# Patient Record
Sex: Female | Born: 2000 | Race: Black or African American | Hispanic: No | Marital: Single | Smoking: Never smoker
Health system: Southern US, Community
[De-identification: ages and names within clinical notes are randomized; demographics above are authoritative.]

## PROBLEM LIST (undated history)

## (undated) DIAGNOSIS — E282 Polycystic ovarian syndrome: Secondary | ICD-10-CM

## (undated) HISTORY — PX: TONSILLECTOMY: SUR1361

---

## 2021-02-07 ENCOUNTER — Emergency Department (HOSPITAL_COMMUNITY)
Admission: EM | Admit: 2021-02-07 | Discharge: 2021-02-08 | Disposition: A | Discharge status: Home / Self Care | Payer: Medicaid - Out of State | Attending: Emergency Medicine | Admitting: Emergency Medicine

## 2021-02-07 DIAGNOSIS — R1012 Left upper quadrant pain: Secondary | ICD-10-CM | POA: Diagnosis present

## 2021-02-07 DIAGNOSIS — Z91040 Latex allergy status: Secondary | ICD-10-CM | POA: Insufficient documentation

## 2021-02-07 DIAGNOSIS — Z91010 Allergy to peanuts: Secondary | ICD-10-CM | POA: Diagnosis not present

## 2021-02-07 LAB — URINALYSIS, COMPLETE (UACMP) WITH MICROSCOPIC
Bacteria, UA: NONE SEEN
Bilirubin Urine: NEGATIVE
Glucose, UA: NEGATIVE mg/dL
Ketones, ur: NEGATIVE mg/dL
Leukocytes,Ua: NEGATIVE
Nitrite: NEGATIVE
Protein, ur: NEGATIVE mg/dL
Specific Gravity, Urine: 1.012 (ref 1.005–1.030)
pH: 5 (ref 5.0–8.0)

## 2021-02-07 LAB — CBC WITH DIFFERENTIAL/PLATELET
Abs Immature Granulocytes: 0.03 10*3/uL (ref 0.00–0.07)
Basophils Absolute: 0.1 10*3/uL (ref 0.0–0.1)
Basophils Relative: 0 %
Eosinophils Absolute: 0.4 10*3/uL (ref 0.0–0.5)
Eosinophils Relative: 3 %
HCT: 41.7 % (ref 36.0–46.0)
Hemoglobin: 13.4 g/dL (ref 12.0–15.0)
Immature Granulocytes: 0 %
Lymphocytes Relative: 28 %
Lymphs Abs: 3.9 10*3/uL (ref 0.7–4.0)
MCH: 26.3 pg (ref 26.0–34.0)
MCHC: 32.1 g/dL (ref 30.0–36.0)
MCV: 81.9 fL (ref 80.0–100.0)
Monocytes Absolute: 0.9 10*3/uL (ref 0.1–1.0)
Monocytes Relative: 6 %
Neutro Abs: 9 10*3/uL — ABNORMAL HIGH (ref 1.7–7.7)
Neutrophils Relative %: 63 %
Platelets: 335 10*3/uL (ref 150–400)
RBC: 5.09 MIL/uL (ref 3.87–5.11)
RDW: 14.6 % (ref 11.5–15.5)
WBC: 14.3 10*3/uL — ABNORMAL HIGH (ref 4.0–10.5)
nRBC: 0 % (ref 0.0–0.2)

## 2021-02-07 LAB — COMPREHENSIVE METABOLIC PANEL
ALT: 15 U/L (ref 0–44)
AST: 16 U/L (ref 15–41)
Albumin: 3.2 g/dL — ABNORMAL LOW (ref 3.5–5.0)
Alkaline Phosphatase: 56 U/L (ref 38–126)
Anion gap: 11 (ref 5–15)
BUN: 6 mg/dL (ref 6–20)
CO2: 21 mmol/L — ABNORMAL LOW (ref 22–32)
Calcium: 8.9 mg/dL (ref 8.9–10.3)
Chloride: 106 mmol/L (ref 98–111)
Creatinine, Ser: 0.66 mg/dL (ref 0.44–1.00)
GFR, Estimated: >60 mL/min (ref >60)
Glucose, Bld: 89 mg/dL (ref 70–99)
Potassium: 3.9 mmol/L (ref 3.5–5.1)
Sodium: 138 mmol/L (ref 135–145)
Total Bilirubin: 0.2 mg/dL — ABNORMAL LOW (ref 0.3–1.2)
Total Protein: 7.4 g/dL (ref 6.5–8.1)

## 2021-02-07 LAB — I-STAT BETA HCG BLOOD, ED (MC, WL, AP ONLY): I-stat hCG, quantitative: <5 m[IU]/mL (ref <5)

## 2021-02-07 LAB — LIPASE, BLOOD: Lipase: 26 U/L (ref 11–51)

## 2021-02-07 LAB — LACTIC ACID, PLASMA: Lactic Acid, Venous: 0.8 mmol/L (ref 0.5–1.9)

## 2021-02-07 MED ORDER — ALUM & MAG HYDROXIDE-SIMETH 200-200-20 MG/5ML PO SUSP
30.0000 mL | Freq: Once | ORAL | Status: AC
  Administered 2021-02-07: 30 mL via ORAL
  Filled 2021-02-07: qty 30

## 2021-02-07 MED ORDER — ACETAMINOPHEN 325 MG PO TABS
650.0000 mg | ORAL_TABLET | Freq: Once | ORAL | Status: AC
  Administered 2021-02-07: 650 mg via ORAL
  Filled 2021-02-07: qty 2

## 2021-02-07 NOTE — ED Provider Notes (Signed)
[facility] EMERGENCY DEPARTMENT Provider Note   CSN: [id number] Arrival date & time: 02/07/21  2038     History Chief Complaint  Patient presents with   Abdominal Pain    Mary David is a 20 y.o. female.  HPI  Patient is a 20 year old female with a history of PCOS, obesity, and immunoglobulin autoimmune deficiency who presents to the ED with complaints of left upper quadrant pain.  Patient reports that it has been constant for the past 3 days and there was no known inciting incident, such as falls, new medications, or known sick contacts.  She describes it as "like when I had mono" and a sensation of a balloon inflating pressure with occasional sharp pains.  It is worsened with changes in posture, and improved by lying flat.  She has not tried anything at home but it became significantly worse today prompting her arrival.  She reports that she has felt more tired than usual and that she had a "scratchy throat" yesterday that has resolved.  She states that yesterday she finished a "month-long period," which is common with her PCOS.  She has had small brown spotting today insistent with end of menses.  She uses a NuvaRing for contraception and denies any prior or current pregnancies. Endorses 1-2 drinks per week ETOH, denies other drug use.   No past medical history on file.  There are no problems to display for this patient.    OB History   No obstetric history on file.     No family history on file.    Home Medications Prior to Admission medications   Not on File    Allergies    Latex, Peanut-containing drug products, Penicillins, and Pork-derived products  Review of Systems   Review of Systems  Constitutional:  Negative for activity change, appetite change, chills and fever.  HENT:  Negative for sore throat and trouble swallowing.   Eyes:  Negative for pain and visual disturbance.  Respiratory:  Negative for cough and shortness of breath.    Cardiovascular:  Negative for chest pain and palpitations.  Gastrointestinal:  Positive for abdominal pain. Negative for diarrhea, nausea and vomiting.  Genitourinary:  Negative for decreased urine volume, difficulty urinating, dyspareunia, dysuria, flank pain, frequency, hematuria, urgency, vaginal discharge and vaginal pain.  Musculoskeletal:  Negative for arthralgias and back pain.  Skin:  Negative for color change, pallor and rash.  Allergic/Immunologic: Negative for immunocompromised state.  Neurological:  Negative for dizziness, seizures, syncope, weakness, light-headedness, numbness and headaches.  Hematological:  Does not bruise/bleed easily.  Psychiatric/Behavioral:  Negative for confusion. The patient is not nervous/anxious.   All other systems reviewed and are negative.  Physical Exam Updated Vital Signs BP 103/75   Pulse 65   Temp 98.7 F (37.1 C) (Oral)   Resp 19   SpO2 100%   Physical Exam Vitals and nursing note reviewed.  Constitutional:      General: She is not in acute distress.    Appearance: Normal appearance. She is well-developed. She is obese. She is not ill-appearing, toxic-appearing or diaphoretic.  HENT:     Head: Normocephalic and atraumatic.     Right Ear: External ear normal.     Left Ear: External ear normal.     Nose: Nose normal.     Mouth/Throat:     Mouth: Mucous membranes are moist.     Pharynx: Oropharynx is clear. No pharyngeal swelling or oropharyngeal exudate.  Eyes:     General:  No scleral icterus.    Extraocular Movements: Extraocular movements intact.     Conjunctiva/sclera: Conjunctivae normal.     Pupils: Pupils are equal, round, and reactive to light.  Cardiovascular:     Rate and Rhythm: Normal rate and regular rhythm.     Pulses: Normal pulses.     Heart sounds: Normal heart sounds. No murmur heard. Pulmonary:     Effort: Pulmonary effort is normal. No respiratory distress.     Breath sounds: Normal breath sounds.   Abdominal:     General: Abdomen is flat. There is no distension. There are no signs of injury.     Palpations: Abdomen is soft. There is no hepatomegaly, splenomegaly, mass or pulsatile mass.     Tenderness: There is abdominal tenderness in the left upper quadrant. There is right CVA tenderness. There is no left CVA tenderness, guarding or rebound. Negative signs include Murphy's sign, Rovsing's sign and McBurney's sign.     Hernia: No hernia is present.     Comments: Subjective TTP, tolerates deep palpation to LUQ without apparent distress  Musculoskeletal:        General: No swelling. Normal range of motion.     Cervical back: Normal range of motion and neck supple. No rigidity or tenderness.     Right lower leg: No edema.     Left lower leg: No edema.  Lymphadenopathy:     Cervical: No cervical adenopathy.  Skin:    General: Skin is warm and dry.     Capillary Refill: Capillary refill takes less than 2 seconds.  Neurological:     General: No focal deficit present.     Mental Status: She is alert and oriented to person, place, and time. Mental status is at baseline.     GCS: GCS eye subscore is 4. GCS verbal subscore is 5. GCS motor subscore is 6.  Psychiatric:        Mood and Affect: Mood normal.        Behavior: Behavior normal.    ED Results / Procedures / Treatments   Labs (all labs ordered are listed, but only abnormal results are displayed) Labs Reviewed  CBC WITH DIFFERENTIAL/PLATELET - Abnormal; Notable for the following components:      Result Value   WBC 14.3 (*)    Neutro Abs 9.0 (*)    All other components within normal limits  COMPREHENSIVE METABOLIC PANEL - Abnormal; Notable for the following components:   CO2 21 (*)    Albumin 3.2 (*)    Total Bilirubin 0.2 (*)    All other components within normal limits  URINALYSIS, COMPLETE (UACMP) WITH MICROSCOPIC - Abnormal; Notable for the following components:   Hgb urine dipstick SMALL (*)    All other components  within normal limits  LACTIC ACID, PLASMA  LIPASE, BLOOD  I-STAT BETA HCG BLOOD, ED ([facility], [facility], [facility] ONLY)    EKG EKG Interpretation  Date/Time:  Sunday February 07 2021 23:38:32 EST Ventricular Rate:  70 PR Interval:  163 QRS Duration: 81 QT Interval:  391 QTC Calculation: 422 R Axis:   71 Text Interpretation: Sinus rhythm No old tracing to compare Confirmed by Linwood Dibbles [phone]) on 02/07/2021 11:40:21 PM  Radiology No results found.  Procedures Procedures   Medications Ordered in ED Medications  alum & mag hydroxide-simeth (MAALOX/MYLANTA) 200-200-20 MG/5ML suspension 30 mL (30 mLs Oral Given 02/07/21 2252)  acetaminophen (TYLENOL) tablet 650 mg (650 mg Oral Given 02/07/21 2215)    ED  Course  I have reviewed the triage vital signs and the nursing notes.  Pertinent labs & imaging results that were available during my care of the patient were reviewed by me and considered in my medical decision making (see chart for details).    MDM Rules/Calculators/A&P                          Mary David is a 20 y.o. female presenting with abdominal pain. Initial VS wnl.   EKG interpretation: NSR, rate 70 bpm, normal intervals, no ST elevations or depressions, T wave inversion in aVR.  No prior EKGs..  Labs: UA with small hematuria, otherwise unremarkable.  Beta hCG undetectable.  Mild leukocytosis with left shift, similar to prior in outside records.  CMP WNL.  Lactic acidosis and lipase WNL.  DDX considered: Appendicitis, cholecystitis, choledocholithiasis, nephrolithiasis, UTI, pyelonephritis, shingles, diverticulitis, constipation, pneumonia, pancreatitis, IUP, ectopic pregnancy, TOA or STI, GERD.  History, examination, and objective data most consistent with mild abdominal pain of unclear etiology.  Patient has no abdominal pain at sites consistent with appendicitis or gallbladder disease.  No jaundice or Murphy sign.  No systemic infectious signs or symptoms.  No heartburn or  nausea.  No respiratory symptoms.  Appears clinically well-hydrated and tolerating p.o. without issue.  Suspect small hematuria consistent with recent menses, patient has no flank tenderness or dysuria, and constant pain inconsistent with nephrolithiasis.  No skin findings.  Pregnancy negative.  Patient denies any urinary or pelvic symptoms.  Patient is overall well-appearing, and there is very low suspicion for emergent etiology.  Abdominal exam overall benign and tolerates deep palpation without any apparent discomfort.  Medications: Medications  alum & mag hydroxide-simeth (MAALOX/MYLANTA) 200-200-20 MG/5ML suspension 30 mL (30 mLs Oral Given 02/07/21 2252)  acetaminophen (TYLENOL) tablet 650 mg (650 mg Oral Given 02/07/21 2215)    Re-evaluated, states symptoms remain present.  Patient appears comfortable, using phone on multiple exams. Hemodynamically stable and in no acute distress.  Shared decision making held with patient, who elects for discharge home with close outpatient follow-up.  Discharged home in stable condition. Strict ED return precautions advised. Supportive care discussed. Outpatient PCP follow-up advised. Patient understands and agrees with the plan.  The plan for this patient was discussed with my attending physician, who voiced agreement and who oversaw evaluation and treatment of this patient.     Note: Estate manager/land agent was used in the creation of this note.  Final Clinical Impression(s) / ED Diagnoses Final diagnoses:  Left upper quadrant abdominal pain    Rx / DC Orders ED Discharge Orders     None        Cherly Hensen, DO 02/08/21 [id number]    Dorie Rank, MD 02/09/21 [phone]

## 2021-02-07 NOTE — ED Triage Notes (Signed)
Sxs onset 3 days ago. Describes squeezing pressure. Front left side. Verbalizes sharp pain occasionally when sitting. Cycle ended yesterday. States it lasted a prolonged amount of time. Around a month.Feels bloated.

## 2021-02-08 NOTE — Discharge Instructions (Signed)
Dear Mary David,  Thank you for allowing [place] to take care of you today.  We hope you begin feeling better soon.  - Please follow-up with your primary care physician or schedule an appointment to establish a primary care doctor if you do not have one already. - Please return to the Emergency Department or call 911 for chest pain, shortness of breath, severe pain, altered mental status, or if you have any reason to think you may need emergency medical care. - Remember to stay well hydrated - Continue to use Ibuprofen and Tylenol as directed as needed for pain - Call your PCP for close follow up as discussed   Sincerely,  Dwaine Gale, DO Department of Emergency Medicine [place]   Left upper quadrant abdominal pain

## 2021-02-19 ENCOUNTER — Encounter (HOSPITAL_COMMUNITY): Payer: Self-pay

## 2021-02-19 ENCOUNTER — Emergency Department (HOSPITAL_COMMUNITY)
Admission: EM | Admit: 2021-02-19 | Discharge: 2021-02-19 | Disposition: A | Discharge status: Home / Self Care | Payer: Medicaid - Out of State | Attending: Emergency Medicine | Admitting: Emergency Medicine

## 2021-02-19 DIAGNOSIS — R519 Headache, unspecified: Secondary | ICD-10-CM | POA: Insufficient documentation

## 2021-02-19 DIAGNOSIS — Z5321 Procedure and treatment not carried out due to patient leaving prior to being seen by health care provider: Secondary | ICD-10-CM | POA: Insufficient documentation

## 2021-02-19 DIAGNOSIS — R112 Nausea with vomiting, unspecified: Secondary | ICD-10-CM | POA: Insufficient documentation

## 2021-02-19 NOTE — ED Notes (Signed)
Pt arrived via POV, c/o nausea x2 wks, headache, and vomiting x2 days. States endorses sick contacts at work.

## 2021-02-20 ENCOUNTER — Other Ambulatory Visit: Payer: Self-pay

## 2021-02-20 ENCOUNTER — Emergency Department (HOSPITAL_COMMUNITY)
Admission: EM | Admit: 2021-02-20 | Discharge: 2021-02-20 | Disposition: A | Discharge status: Home / Self Care | Payer: Medicaid - Out of State | Attending: Student | Admitting: Student

## 2021-02-20 DIAGNOSIS — N9489 Other specified conditions associated with female genital organs and menstrual cycle: Secondary | ICD-10-CM | POA: Insufficient documentation

## 2021-02-20 DIAGNOSIS — R112 Nausea with vomiting, unspecified: Secondary | ICD-10-CM | POA: Diagnosis present

## 2021-02-20 DIAGNOSIS — Z20822 Contact with and (suspected) exposure to covid-19: Secondary | ICD-10-CM | POA: Diagnosis not present

## 2021-02-20 DIAGNOSIS — Z91040 Latex allergy status: Secondary | ICD-10-CM | POA: Insufficient documentation

## 2021-02-20 DIAGNOSIS — R197 Diarrhea, unspecified: Secondary | ICD-10-CM | POA: Diagnosis not present

## 2021-02-20 DIAGNOSIS — R1084 Generalized abdominal pain: Secondary | ICD-10-CM | POA: Diagnosis not present

## 2021-02-20 DIAGNOSIS — Z91010 Allergy to peanuts: Secondary | ICD-10-CM | POA: Insufficient documentation

## 2021-02-20 DIAGNOSIS — R519 Headache, unspecified: Secondary | ICD-10-CM | POA: Insufficient documentation

## 2021-02-20 LAB — URINALYSIS, ROUTINE W REFLEX MICROSCOPIC
Bilirubin Urine: NEGATIVE
Glucose, UA: NEGATIVE mg/dL
Ketones, ur: NEGATIVE mg/dL
Leukocytes,Ua: NEGATIVE
Nitrite: NEGATIVE
Protein, ur: NEGATIVE mg/dL
Specific Gravity, Urine: 1.016 (ref 1.005–1.030)
pH: 5 (ref 5.0–8.0)

## 2021-02-20 LAB — CBC
HCT: 41.3 % (ref 36.0–46.0)
Hemoglobin: 13.2 g/dL (ref 12.0–15.0)
MCH: 26.1 pg (ref 26.0–34.0)
MCHC: 32 g/dL (ref 30.0–36.0)
MCV: 81.8 fL (ref 80.0–100.0)
Platelets: 347 10*3/uL (ref 150–400)
RBC: 5.05 MIL/uL (ref 3.87–5.11)
RDW: 14.6 % (ref 11.5–15.5)
WBC: 11.9 10*3/uL — ABNORMAL HIGH (ref 4.0–10.5)
nRBC: 0 % (ref 0.0–0.2)

## 2021-02-20 LAB — COMPREHENSIVE METABOLIC PANEL
ALT: 14 U/L (ref 0–44)
AST: 20 U/L (ref 15–41)
Albumin: 3 g/dL — ABNORMAL LOW (ref 3.5–5.0)
Alkaline Phosphatase: 60 U/L (ref 38–126)
Anion gap: 8 (ref 5–15)
BUN: 5 mg/dL — ABNORMAL LOW (ref 6–20)
CO2: 24 mmol/L (ref 22–32)
Calcium: 9.1 mg/dL (ref 8.9–10.3)
Chloride: 109 mmol/L (ref 98–111)
Creatinine, Ser: 0.91 mg/dL (ref 0.44–1.00)
GFR, Estimated: >60 mL/min (ref >60)
Glucose, Bld: 115 mg/dL — ABNORMAL HIGH (ref 70–99)
Potassium: 4.4 mmol/L (ref 3.5–5.1)
Sodium: 141 mmol/L (ref 135–145)
Total Bilirubin: 0.4 mg/dL (ref 0.3–1.2)
Total Protein: 6.7 g/dL (ref 6.5–8.1)

## 2021-02-20 LAB — LIPASE, BLOOD: Lipase: 25 U/L (ref 11–51)

## 2021-02-20 LAB — RESP PANEL BY RT-PCR (FLU A&B, COVID) ARPGX2
Influenza A by PCR: NEGATIVE
Influenza B by PCR: NEGATIVE
SARS Coronavirus 2 by RT PCR: NEGATIVE

## 2021-02-20 LAB — I-STAT BETA HCG BLOOD, ED (MC, WL, AP ONLY): I-stat hCG, quantitative: <5 m[IU]/mL (ref <5)

## 2021-02-20 MED ORDER — ONDANSETRON HCL 4 MG PO TABS: 4.0000 mg | ORAL_TABLET | Freq: Four times a day (QID) | ORAL | 0 refills | Status: DC

## 2021-02-20 MED ORDER — ONDANSETRON 4 MG PO TBDP
4.0000 mg | ORAL_TABLET | Freq: Once | ORAL | Status: AC
  Administered 2021-02-20: 4 mg via ORAL
  Filled 2021-02-20: qty 1

## 2021-02-20 NOTE — ED Triage Notes (Signed)
Pt. Stated, Mary David had a headache with N/V/D for the last 2 days , it started with a headache and the N/V/D started later.

## 2021-02-20 NOTE — ED Provider Notes (Signed)
[facility] EMERGENCY DEPARTMENT Provider Note   CSN: [id number] Arrival date & time: 02/20/21  1002     History Chief Complaint  Patient presents with   Emesis   Nausea   Headache   Diarrhea    Mary David is a 20 y.o. female with no significant past medical history who presents to the ED complaining of headache x2 days.  Patient denies sick contacts.  Patient has associated abdominal cramping (localized to upper region), nausea, vomiting, and diarrhea.  She has tried over-the-counter medications with mild relief of her symptoms.  She denies fever, chills abdominal pain, chest pain, shortness of breath, dizziness, lightheadedness, dysuria, hematuria.  Patient denies chance of pregnancy. Patient's last menstrual period was 02/15/2021.  She has been having spotting status post her menstrual period for the past 4 days.    The history is provided by the patient. No language interpreter was used.      No past medical history on file.  There are no problems to display for this patient.   No past surgical history on file.   OB History   No obstetric history on file.     No family history on file.  Social History   Tobacco Use   Smoking status: Never   Smokeless tobacco: Never    Home Medications Prior to Admission medications   Medication Sig Start Date End Date Taking? Authorizing Provider  ondansetron (ZOFRAN) 4 MG tablet Take 1 tablet (4 mg total) by mouth every 6 (six) hours. 02/20/21  Yes Zori Benbrook A, PA-C    Allergies    Latex, Peanut-containing drug products, Penicillins, and Pork-derived products  Review of Systems   Review of Systems  Constitutional:  Negative for chills and fever.  Respiratory:  Negative for shortness of breath.   Cardiovascular:  Negative for chest pain.  Gastrointestinal:  Positive for diarrhea and vomiting. Negative for abdominal pain and nausea.  Genitourinary:  Negative for difficulty urinating, dysuria,  hematuria, vaginal bleeding and vaginal discharge.  Neurological:  Positive for headaches.  All other systems reviewed and are negative.  Physical Exam Updated Vital Signs BP 134/85 (BP Location: Left Arm)   Pulse 94   Temp 100.2 F (37.9 C) (Oral)   Resp 16   LMP 02/15/2021   SpO2 95%   Physical Exam Vitals and nursing note reviewed.  Constitutional:      General: She is not in acute distress.    Appearance: She is not diaphoretic.  HENT:     Head: Normocephalic and atraumatic.     Mouth/Throat:     Pharynx: No oropharyngeal exudate.  Eyes:     General: No scleral icterus.    Conjunctiva/sclera: Conjunctivae normal.  Cardiovascular:     Rate and Rhythm: Normal rate and regular rhythm.     Pulses: Normal pulses.     Heart sounds: Normal heart sounds.  Pulmonary:     Effort: Pulmonary effort is normal. No respiratory distress.     Breath sounds: Normal breath sounds. No wheezing.  Abdominal:     General: Bowel sounds are normal.     Palpations: Abdomen is soft. There is no mass.     Tenderness: There is generalized abdominal tenderness. There is no guarding or rebound.     Comments: Mild diffuse abdominal tenderness to palpation.  No overlying skin changes.  Musculoskeletal:        General: Normal range of motion.     Cervical back: Normal range of motion  and neck supple.     Comments: Strength and sensation intact to bilateral upper and lower extremities.  Skin:    General: Skin is warm and dry.  Neurological:     Mental Status: She is alert.  Psychiatric:        Behavior: Behavior normal.    ED Results / Procedures / Treatments   Labs (all labs ordered are listed, but only abnormal results are displayed) Labs Reviewed  COMPREHENSIVE METABOLIC PANEL - Abnormal; Notable for the following components:      Result Value   Glucose, Bld 115 (*)    BUN 5 (*)    Albumin 3.0 (*)    All other components within normal limits  CBC - Abnormal; Notable for the following  components:   WBC 11.9 (*)    All other components within normal limits  URINALYSIS, ROUTINE W REFLEX MICROSCOPIC - Abnormal; Notable for the following components:   Hgb urine dipstick MODERATE (*)    Bacteria, UA RARE (*)    All other components within normal limits  RESP PANEL BY RT-PCR (FLU A&B, COVID) ARPGX2  LIPASE, BLOOD  I-STAT BETA HCG BLOOD, ED ([facility], [facility], [facility] ONLY)    EKG None  Radiology No results found.  Procedures Procedures   Medications Ordered in ED Medications  ondansetron (ZOFRAN-ODT) disintegrating tablet 4 mg (4 mg Oral Given 02/20/21 1150)    ED Course  I have reviewed the triage vital signs and the nursing notes.  Pertinent labs & imaging results that were available during my care of the patient were reviewed by me and considered in my medical decision making (see chart for details).  Clinical Course as of 02/20/21 1615  Sat Feb 20, 2021  1306 Re-eval prior to discharge, abdominal pain resolved. Nausea improved. Tolerating PO fluids [SB]    Clinical Course User Index [SB] Colton Tassin A, PA-C   MDM Rules/Calculators/A&P                         Patient presents to the emergency department with abdominal pain, nausea, vomiting x 2-3 days.  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  Patient is unaware of sick contacts.  Vital signs stable, patient afebrile, oxygen saturation at 95%.  On exam patient with diffuse abdominal TTP. No focal abdominal pain. Remainder of exam without acute findings.  Differential diagnosis includes pancreatitis, UTI, viral gastroenteritis.  CMP, CBC, lipase unremarkable.  WBC downtrending at 11.9 from previous value 13 days ago at 14.3.  Urinalysis notable for moderate hemoglobin, however patient is still spotting from her recent last menstrual period.  COVID and flu swab negative.  Doubt pancreatitis at this time.  Doubt UTI.  This is likely viral gastroenteritis in etiology.  Patient given Zofran ODT in the ED with resolution of  her nausea.  Patient able to tolerate small sips of water in the ED.  We will send patient home with a prescription for Zofran. Discussed with patient return precautions to the emergency department including fever, worsening or persistent abdominal pain. Patient acknowledges and voices understanding at this time.  Patient appears well and safe for discharge.  Follow-up instructions as indicated in discharge paperwork.    Final Clinical Impression(s) / ED Diagnoses Final diagnoses:  Nausea vomiting and diarrhea    Rx / DC Orders ED Discharge Orders          Ordered    ondansetron (ZOFRAN) 4 MG tablet  Every 6  hours        02/20/21 1304             Mithra Spano A, PA-C 02/20/21 1615    Glendora Score, MD 02/20/21 [phone]

## 2021-02-20 NOTE — Discharge Instructions (Addendum)
Take the zofran as prescribed for nausea or vomiting. You make take 600 mg Ibuprofen every 6 hours or 1,000 mg Tylenol every 6 hours as needed for fever. Ensure to maintain hydrated with fluids. Follow up with your primary care provider as needed. Return to th ED if you are experiencing increasing/worsening abdominal pain, vomiting, inability to maintain fluid intake, or worsening symptoms.

## 2021-03-02 ENCOUNTER — Other Ambulatory Visit: Payer: Self-pay

## 2021-03-02 ENCOUNTER — Encounter (HOSPITAL_COMMUNITY): Payer: Self-pay

## 2021-03-02 ENCOUNTER — Emergency Department (HOSPITAL_COMMUNITY)
Admission: EM | Admit: 2021-03-02 | Discharge: 2021-03-02 | Disposition: A | Discharge status: Home / Self Care | Payer: Medicaid - Out of State | Attending: Student | Admitting: Student

## 2021-03-02 DIAGNOSIS — R6889 Other general symptoms and signs: Secondary | ICD-10-CM

## 2021-03-02 DIAGNOSIS — J029 Acute pharyngitis, unspecified: Secondary | ICD-10-CM | POA: Diagnosis present

## 2021-03-02 DIAGNOSIS — Z20822 Contact with and (suspected) exposure to covid-19: Secondary | ICD-10-CM | POA: Insufficient documentation

## 2021-03-02 DIAGNOSIS — Z91010 Allergy to peanuts: Secondary | ICD-10-CM | POA: Insufficient documentation

## 2021-03-02 DIAGNOSIS — R519 Headache, unspecified: Secondary | ICD-10-CM | POA: Diagnosis not present

## 2021-03-02 DIAGNOSIS — Z91040 Latex allergy status: Secondary | ICD-10-CM | POA: Diagnosis not present

## 2021-03-02 DIAGNOSIS — R110 Nausea: Secondary | ICD-10-CM | POA: Insufficient documentation

## 2021-03-02 DIAGNOSIS — M791 Myalgia, unspecified site: Secondary | ICD-10-CM | POA: Diagnosis not present

## 2021-03-02 HISTORY — DX: Polycystic ovarian syndrome: E28.2

## 2021-03-02 LAB — RESP PANEL BY RT-PCR (FLU A&B, COVID) ARPGX2
Influenza A by PCR: NEGATIVE
Influenza B by PCR: NEGATIVE
SARS Coronavirus 2 by RT PCR: NEGATIVE

## 2021-03-02 MED ORDER — ONDANSETRON HCL 4 MG PO TABS: 4.0000 mg | ORAL_TABLET | Freq: Four times a day (QID) | ORAL | 0 refills | Status: AC

## 2021-03-02 NOTE — ED Provider Notes (Signed)
[facility]-EMERGENCY DEPT Provider Note   CSN: [id number] Arrival date & time: 03/02/21  1244     History Chief Complaint  Patient presents with   Sore Throat   Nausea   Headache   Generalized Body Aches   Chills    Mary David is a 20 y.o. female with a past medical history of asthma presenting today with a sore throat, nausea, myalgias and headache that began in the middle of the night last night.  Also measured a temperature of 101.  Her uncle has the flu.  Denies any chest pain or difficulty breathing but does report that she tires easily.  Past Medical History:  Diagnosis Date   PCOS (polycystic ovarian syndrome)     There are no problems to display for this patient.   Past Surgical History:  Procedure Laterality Date   TONSILLECTOMY       OB History   No obstetric history on file.     Family History  Problem Relation Age of Onset   Thyroid disease Mother    Diabetes Father    Cancer Father     Social History   Tobacco Use   Smoking status: Never   Smokeless tobacco: Never  Vaping Use   Vaping Use: Never used  Substance Use Topics   Alcohol use: Yes    Comment: rarely   Drug use: Yes    Types: Marijuana    Home Medications Prior to Admission medications   Medication Sig Start Date End Date Taking? Authorizing Provider  ondansetron (ZOFRAN) 4 MG tablet Take 1 tablet (4 mg total) by mouth every 6 (six) hours. 02/20/21   Blue, Soijett A, PA-C    Allergies    Latex, Peanut-containing drug products, Penicillins, and Pork-derived products  Review of Systems   Review of Systems  Constitutional:  Positive for chills and fever.  HENT:  Positive for congestion and sore throat.   Respiratory:  Positive for cough.   Cardiovascular:  Negative for chest pain.  Gastrointestinal:  Positive for diarrhea and nausea. Negative for vomiting.  Musculoskeletal:  Positive for myalgias.  Neurological:  Positive for headaches.   Physical  Exam Updated Vital Signs BP (!) 148/99 (BP Location: Left Arm)    Pulse 74    Temp 99.8 F (37.7 C) (Oral)    Resp 18    Ht 5\' 4"  (1.626 m)    Wt 108.9 kg    LMP 02/15/2021    SpO2 99%    BMI 41.20 kg/m   Physical Exam Vitals and nursing note reviewed.  Constitutional:      Appearance: Normal appearance.  HENT:     Head: Normocephalic and atraumatic.     Right Ear: No drainage. Tympanic membrane is erythematous.     Left Ear: Tympanic membrane normal.     Nose: Congestion present.     Mouth/Throat:     Mouth: Mucous membranes are moist.     Pharynx: Oropharynx is clear. Uvula midline. No oropharyngeal exudate.     Tonsils: No tonsillar exudate.  Eyes:     General: No scleral icterus.    Conjunctiva/sclera: Conjunctivae normal.  Pulmonary:     Effort: Pulmonary effort is normal. No respiratory distress.     Breath sounds: No wheezing or rales.  Abdominal:     Palpations: Abdomen is soft.     Tenderness: There is no abdominal tenderness.  Skin:    Findings: No rash.  Neurological:     Mental  Status: She is alert.  Psychiatric:        Mood and Affect: Mood normal.    ED Results / Procedures / Treatments   Labs (all labs ordered are listed, but only abnormal results are displayed) Labs Reviewed  RESP PANEL BY RT-PCR (FLU A&B, COVID) ARPGX2    EKG None  Radiology No results found.  Procedures Procedures   Medications Ordered in ED Medications - No data to display  ED Course  I have reviewed the triage vital signs and the nursing notes.  Pertinent labs & imaging results that were available during my care of the patient were reviewed by me and considered in my medical decision making (see chart for details).    MDM Rules/Calculators/A&P 20 year old female presenting with flulike symptoms.  Reports that they started in the middle the night.  Has a known sick contact.  Swabs negative.  Discussed proper over-the-counter care for her symptoms.  She expressed  understanding.  Discharged  Final Clinical Impression(s) / ED Diagnoses Final diagnoses:  Flu-like symptoms    Rx / DC Orders Results and diagnoses were explained to the patient. Return precautions discussed in full. Patient had no additional questions and expressed complete understanding.     Woodroe Chen 03/04/21 6734    Glendora Score, MD 03/04/21 [phone]

## 2021-03-02 NOTE — ED Triage Notes (Addendum)
Patient reports headache, generalized body aches, sore throat, and chills since last night.  Patient added at tahe end of triage that she had a "slight cough and trouble taking a deep breath."

## 2021-03-02 NOTE — Discharge Instructions (Addendum)
Your flu and Covid tests are negative today. You may treat your symptoms with over the counter medications.  I have sent more of the nausea medication that we discussed to the pharmacy.  A work note is also attached.

## 2021-03-02 NOTE — ED Provider Notes (Signed)
Emergency Medicine Provider Triage Evaluation Note  Mary David , a 20 y.o. female  was evaluated in triage.  Pt complains of headache, congestion, sore throat, congestion, nausea without vomiting for the last day. Sick contacts. Lack of appetite. No flu shot this year -- hx of reaction. Hx of asthma, no increased inhaler use.  Review of Systems  Positive: As above Negative: Chest pain  Physical Exam  BP (!) 148/99 (BP Location: Left Arm)    Pulse 74    Temp 99.8 F (37.7 C) (Oral)    Resp 18    Ht 5\' 4"  (1.626 m)    Wt 108.9 kg    LMP 02/15/2021    SpO2 99%    BMI 41.20 kg/m  Gen:   Awake, no distress   Resp:  Normal effort  MSK:   Moves extremities without difficulty  Other:  No wheezing, post oropharynx clear  Medical Decision Making  Medically screening exam initiated at 1:49 PM.  Appropriate orders placed.  Mary David was informed that the remainder of the evaluation will be completed by another provider, this initial triage assessment does not replace that evaluation, and the importance of remaining in the ED until their evaluation is complete.  Flu like sx, okay for fast track   Nehemiah Massed 03/02/21 1351    03/04/21, MD 03/02/21 1606

## 2021-04-07 ENCOUNTER — Other Ambulatory Visit: Payer: Self-pay

## 2021-04-07 ENCOUNTER — Emergency Department (HOSPITAL_COMMUNITY)
Admission: EM | Admit: 2021-04-07 | Discharge: 2021-04-07 | Disposition: A | Discharge status: Home / Self Care | Payer: Medicaid - Out of State | Attending: Emergency Medicine | Admitting: Emergency Medicine

## 2021-04-07 ENCOUNTER — Encounter (HOSPITAL_COMMUNITY): Payer: Self-pay | Admitting: Emergency Medicine

## 2021-04-07 DIAGNOSIS — Z20822 Contact with and (suspected) exposure to covid-19: Secondary | ICD-10-CM | POA: Diagnosis not present

## 2021-04-07 DIAGNOSIS — D72829 Elevated white blood cell count, unspecified: Secondary | ICD-10-CM | POA: Insufficient documentation

## 2021-04-07 DIAGNOSIS — Z91040 Latex allergy status: Secondary | ICD-10-CM | POA: Insufficient documentation

## 2021-04-07 DIAGNOSIS — N939 Abnormal uterine and vaginal bleeding, unspecified: Secondary | ICD-10-CM | POA: Insufficient documentation

## 2021-04-07 DIAGNOSIS — R202 Paresthesia of skin: Secondary | ICD-10-CM | POA: Diagnosis not present

## 2021-04-07 DIAGNOSIS — N9489 Other specified conditions associated with female genital organs and menstrual cycle: Secondary | ICD-10-CM | POA: Insufficient documentation

## 2021-04-07 DIAGNOSIS — Z91010 Allergy to peanuts: Secondary | ICD-10-CM | POA: Diagnosis not present

## 2021-04-07 DIAGNOSIS — R300 Dysuria: Secondary | ICD-10-CM | POA: Diagnosis not present

## 2021-04-07 LAB — URINALYSIS, ROUTINE W REFLEX MICROSCOPIC
Bilirubin Urine: NEGATIVE
Glucose, UA: NEGATIVE mg/dL
Ketones, ur: NEGATIVE mg/dL
Nitrite: NEGATIVE
Protein, ur: NEGATIVE mg/dL
Specific Gravity, Urine: 1.019 (ref 1.005–1.030)
pH: 5 (ref 5.0–8.0)

## 2021-04-07 LAB — CBC
HCT: 42.8 % (ref 36.0–46.0)
Hemoglobin: 14.1 g/dL (ref 12.0–15.0)
MCH: 26.7 pg (ref 26.0–34.0)
MCHC: 32.9 g/dL (ref 30.0–36.0)
MCV: 80.9 fL (ref 80.0–100.0)
Platelets: 371 10*3/uL (ref 150–400)
RBC: 5.29 MIL/uL — ABNORMAL HIGH (ref 3.87–5.11)
RDW: 14.6 % (ref 11.5–15.5)
WBC: 16.4 10*3/uL — ABNORMAL HIGH (ref 4.0–10.5)
nRBC: 0 % (ref 0.0–0.2)

## 2021-04-07 LAB — CK: Total CK: 126 U/L (ref 38–234)

## 2021-04-07 LAB — BASIC METABOLIC PANEL
Anion gap: 7 (ref 5–15)
BUN: 8 mg/dL (ref 6–20)
CO2: 24 mmol/L (ref 22–32)
Calcium: 8.6 mg/dL — ABNORMAL LOW (ref 8.9–10.3)
Chloride: 106 mmol/L (ref 98–111)
Creatinine, Ser: 0.61 mg/dL (ref 0.44–1.00)
GFR, Estimated: >60 mL/min (ref >60)
Glucose, Bld: 95 mg/dL (ref 70–99)
Potassium: 3.7 mmol/L (ref 3.5–5.1)
Sodium: 137 mmol/L (ref 135–145)

## 2021-04-07 LAB — I-STAT BETA HCG BLOOD, ED (MC, WL, AP ONLY): I-stat hCG, quantitative: <5 m[IU]/mL (ref <5)

## 2021-04-07 LAB — RESP PANEL BY RT-PCR (FLU A&B, COVID) ARPGX2
Influenza A by PCR: NEGATIVE
Influenza B by PCR: NEGATIVE
SARS Coronavirus 2 by RT PCR: NEGATIVE

## 2021-04-07 NOTE — ED Provider Notes (Signed)
[facility] DEPT Provider Note   CSN: [id number] Arrival date & time: 04/07/21  1156     History  Chief Complaint  Patient presents with   Vaginal Bleeding   arm numbness   leg numbness    Mary David is a 21 y.o. female.  Patient with history of PCOS presents today with chief complaint of vaginal bleeding.  Patient states that same has been going on intermittently over the past several months. She states that she will have bleeding for several days followed by a day or so of spotting with some short periods without bleeding, however no more than a few days before she starts bleeding again. On bad days she states that she will soak through several pads at a time with tampons with bleeding that has her resorting to wearing diapers. She states that she has an appointment scheduled with her OB/GYN on Monday for management of this. Additionally, she states that yesterday she started having some numbness in her bilateral upper and lower extremities that she describes as feeling like pins and needles and is exclusively in the proximal muscles of her bilateral upper extremities and throughout her bilateral lower extremities. She denies any lightheadedness or SOB. She is able to walk without difficulty. She denies any pain. She does endorse 1 episode of dysuria last week. Denies vaginal discharge. No new sexual partners, adamantly denies any risk of STDs.   The history is provided by the patient. No language interpreter was used.  Vaginal Bleeding Associated symptoms: no dizziness, no fever and no nausea       Home Medications Prior to Admission medications   Medication Sig Start Date End Date Taking? Authorizing Provider  EPINEPHrine 0.3 mg/0.3 mL IJ SOAJ injection Inject 0.3 mg into the muscle once as needed for anaphylaxis.   Yes [provider]  NUVARING 0.12-0.015 MG/24HR vaginal ring Place 1 each vaginally every 21 ( twenty-one) days. Insert  vaginally and leave in place for 3 consecutive weeks, then remove for 1 week.   Yes [provider]      Allergies    Other, Peanut oil, Peanut-containing drug products, Shellfish-derived products, Latex, Penicillins, and Pork-derived products    Review of Systems   Review of Systems  Constitutional:  Negative for chills and fever.  Cardiovascular:  Negative for leg swelling.  Gastrointestinal:  Negative for diarrhea, nausea and vomiting.  Genitourinary:  Positive for menstrual problem and vaginal bleeding. Negative for pelvic pain and vaginal pain.  Allergic/Immunologic: Negative for immunocompromised state.  Neurological:  Positive for numbness. Negative for dizziness, tremors, seizures, syncope, facial asymmetry, speech difficulty, weakness, light-headedness and headaches.  All other systems reviewed and are negative.  Physical Exam Updated Vital Signs BP 121/68    Pulse 80    Temp 99.4 F (37.4 C) (Oral)    Resp 16    SpO2 99%  Physical Exam Vitals and nursing note reviewed.  Constitutional:      General: She is not in acute distress.    Appearance: Normal appearance. She is obese. She is not ill-appearing, toxic-appearing or diaphoretic.     Comments: Patient resting comfortably in bed in no acute distress  HENT:     Head: Normocephalic and atraumatic.  Eyes:     Extraocular Movements: Extraocular movements intact.     Pupils: Pupils are equal, round, and reactive to light.  Cardiovascular:     Rate and Rhythm: Normal rate and regular rhythm.     Pulses: Normal  pulses.     Heart sounds: Normal heart sounds.  Pulmonary:     Effort: Pulmonary effort is normal.     Breath sounds: Normal breath sounds.  Abdominal:     General: Abdomen is flat. There is no distension.     Palpations: Abdomen is soft.     Tenderness: There is no abdominal tenderness. There is no right CVA tenderness, left CVA tenderness or guarding.  Musculoskeletal:     Cervical back: Normal range  of motion and neck supple. No tenderness.  Neurological:     Mental Status: She is alert and oriented to person, place, and time.     GCS: GCS eye subscore is 4. GCS verbal subscore is 5. GCS motor subscore is 6.     Sensory: Sensation is intact.     Motor: Motor function is intact.     Coordination: Coordination is intact.     Gait: Gait is intact.     Comments: Alert and oriented to self, place, time and event.    Speech is fluent, clear without dysarthria or dysphasia.    Strength 5/5 in upper/lower extremities   Sensation intact in upper/lower extremities   Patient able to stand up from the bed and ambulate without difficulty or assistance    ED Results / Procedures / Treatments   Labs (all labs ordered are listed, but only abnormal results are displayed) Labs Reviewed  CBC - Abnormal; Notable for the following components:      Result Value   WBC 16.4 (*)    RBC 5.29 (*)    All other components within normal limits  BASIC METABOLIC PANEL - Abnormal; Notable for the following components:   Calcium 8.6 (*)    All other components within normal limits  URINALYSIS, ROUTINE W REFLEX MICROSCOPIC - Abnormal; Notable for the following components:   APPearance CLOUDY (*)    Hgb urine dipstick LARGE (*)    Leukocytes,Ua MODERATE (*)    Bacteria, UA FEW (*)    All other components within normal limits  RESP PANEL BY RT-PCR (FLU A&B, COVID) ARPGX2  CK  I-STAT BETA HCG BLOOD, ED ([facility], [facility], [facility] ONLY)    EKG None  Radiology No results found.  Procedures Procedures    Medications Ordered in ED Medications - No data to display  ED Course/ Medical Decision Making/ A&P                           Medical Decision Making Amount and/or Complexity of Data Reviewed Labs: ordered.   This patient presents to the ED for concern of abnormal vaginal bleeding and numbness/tingling in bilateral upper and lower extremities   Co morbidities that complicate the patient  evaluation  PCOS   Lab Tests:  I Ordered, and personally interpreted labs.  The pertinent results include:  CBC with leukocytosis at 16.4. Hgb 14.1. No electrolyte abnormalities. CK normal. Hcg negative. COVID and flu negative. UA with moderate leukocytes and few bacteria with blood, however patient is on her menstrual cycle   Test Considered:  Considered abdominal imaging with CT or [place], however patient pain free with normal hgb, therefore do not feel that imaging is warranted at this time Considered STD testing and pelvic exam, however patient adamantly refused   Dispostion:  After consideration of the diagnostic results and the patients response to treatment, I feel that the patent would benefit from outpatient management of symptoms with close OB/GYN follow-up.  Patient states that she has OB/GYN appointment on Monday for these symptoms.  Patient presents with longstanding abnormal uterine bleeding associated with PCOS. States that she has an appointment with OB/GYN for management of this on Monday. She states that she has been bleeding for several months with only a few days at a time without bleeding.   Patient with leukocytes and bacteria in her urine, offered antibiotics for management of this in the presence of associated dysuria, patient refused.  States that yesterday she had bilateral upper and lower extremity numbness and tingling that came on all of a sudden and has yet to resolve. She describes the sensation at pins and needles. No associated weakness. States that the symptoms have not been progressively worsening. Endorses symptoms in proximal bilateral upper extremities and throughout bilateral lower extremities. Denies recent strenuous activity. CK normal, therefore low suspicion for rhabdomyolysis, polymyositis, or dermatomyositis. In the absence of weakness associated with symptoms or any other red flag symptoms, low suspicion of neuromuscular disease. Patient is otherwise  healthy with no comorbid conditions. She is afebrile, non-toxic appearing, and in no acute distress with reassuring vital signs. Therefore feel she is stable for discharge at this time. She is amenable with this plan and educated on red flag symptoms that would prompt immediate return. Discharged in stable condition.     Final Clinical Impression(s) / ED Diagnoses Final diagnoses:  Abnormal uterine bleeding (AUB)    Rx / DC Orders ED Discharge Orders     None     An After Visit Summary was printed and given to the patient.     Nestor Lewandowsky 04/07/21 2345    Regan Lemming, MD 04/08/21 [phone]

## 2021-04-07 NOTE — ED Provider Triage Note (Addendum)
Emergency Medicine Provider Triage Evaluation Note  Mary David , a 21 y.o. female  was evaluated in triage.  Pt complains of vaginal bleeding. States that same has been ongoing for the past 2 months, she states that she has been soaking through several pads per day with tampons and has even resorted to wearing diapers. Hx of PCOS. She states that yesterday she started having some numbness and weakness in her bilateral upper and lower extremities that was concerning to her as well. She denies any lightheadedness or SOB. She is able to walk without difficulty. She denies any pain.  Review of Systems  Positive:  Negative: See above  Physical Exam  BP (!) 172/105 (BP Location: Left Arm)    Pulse 86    Temp 99.4 F (37.4 C) (Oral)    Resp 18    SpO2 98%  Gen:   Awake, no distress   Resp:  Normal effort  MSK:   Moves extremities without difficulty  Other:  Overall neurologically intact with 5/5 strength in bilateral upper and lower extremities  Medical Decision Making  Medically screening exam initiated at 1:59 PM.  Appropriate orders placed.  Mary David was informed that the remainder of the evaluation will be completed by another provider, this initial triage assessment does not replace that evaluation, and the importance of remaining in the ED until their evaluation is complete.    Bud Face, PA-C 04/07/21 1404    Gicela Schwarting, Leary Roca, PA-C 04/07/21 1405

## 2021-04-07 NOTE — ED Triage Notes (Signed)
Patient presents with numbness in her arms and legs. She also reports vaginal bleeding for 2 months.

## 2021-04-07 NOTE — Discharge Instructions (Addendum)
Your work-up in the ER this evening was unremarkable for acute abnormalities. Please follow-up with your OB/GYN for further evaluation and management of your symptoms.  Return if development of any new or worsening symptoms.

## 2021-05-20 ENCOUNTER — Encounter (HOSPITAL_COMMUNITY): Payer: Self-pay

## 2021-05-20 ENCOUNTER — Emergency Department (HOSPITAL_COMMUNITY)
Admission: EM | Admit: 2021-05-20 | Discharge: 2021-05-20 | Disposition: A | Discharge status: Home / Self Care | Payer: Medicaid - Out of State | Attending: Emergency Medicine | Admitting: Emergency Medicine

## 2021-05-20 ENCOUNTER — Other Ambulatory Visit: Payer: Self-pay

## 2021-05-20 DIAGNOSIS — Z91010 Allergy to peanuts: Secondary | ICD-10-CM | POA: Diagnosis not present

## 2021-05-20 DIAGNOSIS — D72829 Elevated white blood cell count, unspecified: Secondary | ICD-10-CM | POA: Diagnosis not present

## 2021-05-20 DIAGNOSIS — G44201 Tension-type headache, unspecified, intractable: Secondary | ICD-10-CM

## 2021-05-20 DIAGNOSIS — G44209 Tension-type headache, unspecified, not intractable: Secondary | ICD-10-CM | POA: Diagnosis not present

## 2021-05-20 DIAGNOSIS — Z91040 Latex allergy status: Secondary | ICD-10-CM | POA: Diagnosis not present

## 2021-05-20 DIAGNOSIS — R519 Headache, unspecified: Secondary | ICD-10-CM | POA: Diagnosis present

## 2021-05-20 DIAGNOSIS — R700 Elevated erythrocyte sedimentation rate: Secondary | ICD-10-CM | POA: Insufficient documentation

## 2021-05-20 LAB — URINALYSIS, ROUTINE W REFLEX MICROSCOPIC
Bilirubin Urine: NEGATIVE
Glucose, UA: NEGATIVE mg/dL
Ketones, ur: NEGATIVE mg/dL
Nitrite: NEGATIVE
Protein, ur: NEGATIVE mg/dL
Specific Gravity, Urine: 1.015 (ref 1.005–1.030)
pH: 5 (ref 5.0–8.0)

## 2021-05-20 LAB — CBC WITH DIFFERENTIAL/PLATELET
Abs Immature Granulocytes: 0.06 10*3/uL (ref 0.00–0.07)
Basophils Absolute: 0.1 10*3/uL (ref 0.0–0.1)
Basophils Relative: 0 %
Eosinophils Absolute: 0.3 10*3/uL (ref 0.0–0.5)
Eosinophils Relative: 2 %
HCT: 41 % (ref 36.0–46.0)
Hemoglobin: 13.8 g/dL (ref 12.0–15.0)
Immature Granulocytes: 0 %
Lymphocytes Relative: 25 %
Lymphs Abs: 4.1 10*3/uL — ABNORMAL HIGH (ref 0.7–4.0)
MCH: 26.9 pg (ref 26.0–34.0)
MCHC: 33.7 g/dL (ref 30.0–36.0)
MCV: 79.9 fL — ABNORMAL LOW (ref 80.0–100.0)
Monocytes Absolute: 1.1 10*3/uL — ABNORMAL HIGH (ref 0.1–1.0)
Monocytes Relative: 7 %
Neutro Abs: 11 10*3/uL — ABNORMAL HIGH (ref 1.7–7.7)
Neutrophils Relative %: 66 %
Platelets: 363 10*3/uL (ref 150–400)
RBC: 5.13 MIL/uL — ABNORMAL HIGH (ref 3.87–5.11)
RDW: 14.1 % (ref 11.5–15.5)
WBC: 16.6 10*3/uL — ABNORMAL HIGH (ref 4.0–10.5)
nRBC: 0 % (ref 0.0–0.2)

## 2021-05-20 LAB — BASIC METABOLIC PANEL
Anion gap: 2 — ABNORMAL LOW (ref 5–15)
BUN: 13 mg/dL (ref 6–20)
CO2: 23 mmol/L (ref 22–32)
Calcium: 8.5 mg/dL — ABNORMAL LOW (ref 8.9–10.3)
Chloride: 110 mmol/L (ref 98–111)
Creatinine, Ser: 0.72 mg/dL (ref 0.44–1.00)
GFR, Estimated: >60 mL/min (ref >60)
Glucose, Bld: 107 mg/dL — ABNORMAL HIGH (ref 70–99)
Potassium: 3.7 mmol/L (ref 3.5–5.1)
Sodium: 135 mmol/L (ref 135–145)

## 2021-05-20 LAB — SEDIMENTATION RATE: Sed Rate: 42 mm/hr — ABNORMAL HIGH (ref 0–22)

## 2021-05-20 LAB — C-REACTIVE PROTEIN: CRP: 3.2 mg/dL — ABNORMAL HIGH (ref <1.0)

## 2021-05-20 MED ORDER — DIPHENHYDRAMINE HCL 50 MG/ML IJ SOLN
25.0000 mg | Freq: Once | INTRAMUSCULAR | Status: AC
Start: 2021-05-20 — End: 2021-05-20
  Administered 2021-05-20: 25 mg via INTRAVENOUS
  Filled 2021-05-20: qty 1

## 2021-05-20 MED ORDER — METOCLOPRAMIDE HCL 5 MG/ML IJ SOLN
10.0000 mg | Freq: Once | INTRAMUSCULAR | Status: AC
  Administered 2021-05-20: 10 mg via INTRAVENOUS
  Filled 2021-05-20: qty 2

## 2021-05-20 MED ORDER — KETOROLAC TROMETHAMINE 15 MG/ML IJ SOLN
15.0000 mg | Freq: Once | INTRAMUSCULAR | Status: AC
  Administered 2021-05-20: 15 mg via INTRAVENOUS
  Filled 2021-05-20: qty 1

## 2021-05-20 MED ORDER — LACTATED RINGERS IV BOLUS
1000.0000 mL | Freq: Once | INTRAVENOUS | Status: AC
  Administered 2021-05-20: 1000 mL via INTRAVENOUS

## 2021-05-20 NOTE — Discharge Instructions (Signed)
Your work-up today was quite reassuring.  Glad to hear that your symptoms resolved with her migraine cocktail.  It sounds like your symptoms are something called a tension headache which causes back pain on both sides of your head.  If this happens again, I recommend trying Tylenol and Motrin.  Otherwise feel free to return to the emergency department if you need assistance. ?

## 2021-05-20 NOTE — ED Triage Notes (Signed)
Patient c/o headache x 3 days. Patient states her "pain is traveling down her spine. Patient also reports that she vomited x 3 today as well. ? ?Patient denies any blurred vision. ?

## 2021-05-20 NOTE — ED Provider Notes (Signed)
?[facility] DEPT ?Provider Note ? ? ?CSN: [id number] ?Arrival date & time: 05/20/21  1840 ? ?  ? ?History ? ?Chief Complaint  ?Patient presents with  ? Headache  ? ? ?Mary David is a 21 y.o. female who presents to the ED for evaluation of headache for 3 days that seems to be radiating down the back of her neck.  Describes pain on her bilateral temples, worse with movement of her eyes.  Over the last 24 hours, she has felt pain starting to radiate down the back of her neck.  She has tried Tylenol and Motrin at home without any significant relief.  She had to call out of work today secondary to pain.  Currently she denies vision changes, photosensitivity, fevers, chills and back pain. ? ?Headache ? ?  ? ?Home Medications ?Prior to Admission medications   ?Medication Sig Start Date End Date Taking? Authorizing Provider  ?EPINEPHrine 0.3 mg/0.3 mL IJ SOAJ injection Inject 0.3 mg into the muscle once as needed for anaphylaxis.    [provider]  ?NUVARING 0.12-0.015 MG/24HR vaginal ring Place 1 each vaginally every 21 ( twenty-one) days. Insert vaginally and leave in place for 3 consecutive weeks, then remove for 1 week.    [provider]  ?   ? ?Allergies    ?Other, Peanut oil, Peanut-containing drug products, Shellfish-derived products, Latex, Penicillins, and Pork-derived products   ? ?Review of Systems   ?Review of Systems  ?Neurological:  Positive for headaches.  ? ?Physical Exam ?Updated Vital Signs ?BP (!) 142/82   Pulse 78   Temp 98.8 ?F (37.1 ?C) (Oral)   Resp 18   Ht $R'5\' 6"'zw$  (1.676 m)   Wt 112.5 kg   LMP 05/18/2021   SpO2 99%   BMI 40.03 kg/m?  ?Physical Exam ?Vitals and nursing note reviewed.  ?Constitutional:   ?   General: She is not in acute distress. ?   Appearance: She is not ill-appearing.  ?HENT:  ?   Head: Atraumatic.  ?   Comments: Tender to palpation of the bilateral temporals ?Eyes:  ?   Extraocular Movements: Extraocular movements intact.  ?    Conjunctiva/sclera: Conjunctivae normal.  ?   Pupils: Pupils are equal, round, and reactive to light.  ?Neck:  ?   Meningeal: Brudzinski's sign and Kernig's sign absent.  ?   Comments: EOM intact although she notes increase of head pain when looking laterally in either direction ?Cardiovascular:  ?   Rate and Rhythm: Normal rate and regular rhythm.  ?   Pulses: Normal pulses.  ?   Heart sounds: No murmur heard. ?Pulmonary:  ?   Effort: Pulmonary effort is normal. No respiratory distress.  ?   Breath sounds: Normal breath sounds.  ?Abdominal:  ?   General: Abdomen is flat. There is no distension.  ?   Palpations: Abdomen is soft.  ?   Tenderness: There is no abdominal tenderness.  ?Musculoskeletal:     ?   General: Normal range of motion.  ?   Cervical back: Normal range of motion and neck supple. No rigidity.  ?Skin: ?   General: Skin is warm and dry.  ?   Capillary Refill: Capillary refill takes less than 2 seconds.  ?Neurological:  ?   General: No focal deficit present.  ?   Mental Status: She is alert and oriented to person, place, and time.  ?Psychiatric:     ?   Mood and Affect: Mood normal.  ? ? ?  ED Results / Procedures / Treatments   ?Labs ?(all labs ordered are listed, but only abnormal results are displayed) ?Labs Reviewed  ?BASIC METABOLIC PANEL - Abnormal; Notable for the following components:  ?    Result Value  ? Glucose, Bld 107 (*)   ? Calcium 8.5 (*)   ? Anion gap 2 (*)   ? All other components within normal limits  ?CBC WITH DIFFERENTIAL/PLATELET - Abnormal; Notable for the following components:  ? WBC 16.6 (*)   ? RBC 5.13 (*)   ? MCV 79.9 (*)   ? Neutro Abs 11.0 (*)   ? Lymphs Abs 4.1 (*)   ? Monocytes Absolute 1.1 (*)   ? All other components within normal limits  ?SEDIMENTATION RATE - Abnormal; Notable for the following components:  ? Sed Rate 42 (*)   ? All other components within normal limits  ?URINALYSIS, ROUTINE W REFLEX MICROSCOPIC - Abnormal; Notable for the following components:  ?  APPearance HAZY (*)   ? Hgb urine dipstick MODERATE (*)   ? Leukocytes,Ua SMALL (*)   ? Bacteria, UA RARE (*)   ? All other components within normal limits  ?C-REACTIVE PROTEIN  ? ? ?EKG ?None ? ?Radiology ?No results found. ? ?Procedures ?Procedures  ? ?Medications Ordered in ED ?Medications  ?lactated ringers bolus 1,000 mL (1,000 mLs Intravenous New Bag/Given 05/20/21 1945)  ?metoCLOPramide (REGLAN) injection 10 mg (10 mg Intravenous Given 05/20/21 1946)  ?ketorolac (TORADOL) 15 MG/ML injection 15 mg (15 mg Intravenous Given 05/20/21 1946)  ?diphenhydrAMINE (BENADRYL) injection 25 mg (25 mg Intravenous Given 05/20/21 1947)  ? ? ?ED Course/ Medical Decision Making/ A&P ?  ?                        ?Medical Decision Making ?Amount and/or Complexity of Data Reviewed ?Labs: ordered. ? ?Risk ?Prescription drug management. ? ? ?History:  ?Per HPI ?Social determinants of health: none ? ?Initial impression: ? ?This patient presents to the ED for concern of headache, this involves an extensive number of treatment options, and is a complaint that carries with it a high risk of complications and morbidity.   Differentials include tension headache, migraine, giant cell arteritis, meningitis, URI ?Patient is an overall well-appearing 21 year old female in no acute distress, nontoxic-appearing.  Physical exam significant for tenderness palpation of her bilateral temporals.  Physical exam otherwise benign.  Neurovascularly intact.  We will give her migraine cocktail and obtain labs.  There is small concern of giant cell arteritis given that she has tender to palpation at her temporal regions, although this is less likely given her age and its bilateral nature. ? ? ?Lab Tests and EKG: ? ?I Ordered, reviewed, and interpreted labs and EKG.  The pertinent results include:  ?CBC with leukocytosis of 16 ?CMP unremarkable ?ESR mildly elevated at 42 ? ?Medicines ordered and prescription drug management: ? ?I ordered medication  including: ?Reglan 10 mg, Benadryl 25 mg, Toradol 15 mg IV for headache relief ?Reevaluation of the patient after these medicines showed that the patient resolved.  Patient no longer tender palpation of her bilateral temporal regions. ?I have reviewed the patients home medicines and have made adjustments as needed ? ?Disposition: ? ?After consideration of the diagnostic results, physical exam, history and the patients response to treatment feel that the patent would benefit from discharge with outpatient follow-up.   ?Tension headache: Patient symptoms completely resolved with migraine cocktail.  There was some mild elevation of  her ESR, however after discussion with Dr. Olene Floss very unlikely that patient is experiencing giant cell arteritis.  Is likely tension headache given his bilateral nature rating down her neck.  All return precautions were discussed.  All questions were asked and answered patient was discharged home in good condition. ? ? ?Final Clinical Impression(s) / ED Diagnoses ?Final diagnoses:  ?Acute intractable tension-type headache  ? ? ?Rx / DC Orders ?ED Discharge Orders   ? ? None  ? ?  ? ? ?  ?Tonye Pearson, [place] ?05/20/21 2201 ? ?  ?Drenda Freeze, MD ?05/20/21 2324 ? ?

## 2021-05-29 ENCOUNTER — Emergency Department (HOSPITAL_COMMUNITY): Payer: Medicaid - Out of State

## 2021-05-29 ENCOUNTER — Other Ambulatory Visit: Payer: Self-pay

## 2021-05-29 ENCOUNTER — Encounter (HOSPITAL_COMMUNITY): Payer: Self-pay | Admitting: *Deleted

## 2021-05-29 ENCOUNTER — Emergency Department (HOSPITAL_COMMUNITY)
Admission: EM | Admit: 2021-05-29 | Discharge: 2021-05-29 | Disposition: A | Discharge status: Home / Self Care | Payer: Medicaid - Out of State | Attending: Emergency Medicine | Admitting: Emergency Medicine

## 2021-05-29 DIAGNOSIS — D72829 Elevated white blood cell count, unspecified: Secondary | ICD-10-CM | POA: Insufficient documentation

## 2021-05-29 DIAGNOSIS — Z91040 Latex allergy status: Secondary | ICD-10-CM | POA: Insufficient documentation

## 2021-05-29 DIAGNOSIS — R519 Headache, unspecified: Secondary | ICD-10-CM | POA: Insufficient documentation

## 2021-05-29 DIAGNOSIS — Z91010 Allergy to peanuts: Secondary | ICD-10-CM | POA: Insufficient documentation

## 2021-05-29 DIAGNOSIS — N9489 Other specified conditions associated with female genital organs and menstrual cycle: Secondary | ICD-10-CM | POA: Insufficient documentation

## 2021-05-29 DIAGNOSIS — R700 Elevated erythrocyte sedimentation rate: Secondary | ICD-10-CM | POA: Insufficient documentation

## 2021-05-29 LAB — I-STAT BETA HCG BLOOD, ED (MC, WL, AP ONLY): I-stat hCG, quantitative: <5 m[IU]/mL (ref <5)

## 2021-05-29 MED ORDER — SODIUM CHLORIDE 0.9 % IV BOLUS
1000.0000 mL | Freq: Once | INTRAVENOUS | Status: AC
Start: 2021-05-29 — End: 2021-05-29
  Administered 2021-05-29: 1000 mL via INTRAVENOUS

## 2021-05-29 MED ORDER — DIPHENHYDRAMINE HCL 50 MG/ML IJ SOLN
12.5000 mg | Freq: Once | INTRAMUSCULAR | Status: AC
  Administered 2021-05-29: 12.5 mg via INTRAVENOUS
  Filled 2021-05-29: qty 1

## 2021-05-29 MED ORDER — METOCLOPRAMIDE HCL 5 MG/ML IJ SOLN
10.0000 mg | Freq: Once | INTRAMUSCULAR | Status: DC
  Filled 2021-05-29: qty 2

## 2021-05-29 MED ORDER — KETOROLAC TROMETHAMINE 15 MG/ML IJ SOLN
15.0000 mg | Freq: Once | INTRAMUSCULAR | Status: AC
  Administered 2021-05-29: 15 mg via INTRAVENOUS
  Filled 2021-05-29: qty 1

## 2021-05-29 MED ORDER — PROCHLORPERAZINE EDISYLATE 10 MG/2ML IJ SOLN
10.0000 mg | Freq: Once | INTRAMUSCULAR | Status: AC
  Administered 2021-05-29: 10 mg via INTRAVENOUS
  Filled 2021-05-29: qty 2

## 2021-05-29 NOTE — ED Provider Notes (Signed)
[facility] EMERGENCY DEPARTMENT Provider Note   CSN: [id number] Arrival date & time: 05/29/21  8119     History  Chief Complaint  Patient presents with   Headache    Mary David is a 21 y.o. female.  21 year old female presents today for evaluation of headache.  Headache is intermittent and random and improves after taking Motrin.  Patient was on her way to work when she had an episode of headache and she spoke with her aunt who recommended she come to the emergency room to be evaluated.  Patient has had multiple episode and this is her third visit between the emergency room and urgent care.  She denies fever, change in vision, loss of vision, history of migraines.  Denies family history of any rheumatologic condition, migraines.  Reports fluctuating blood pressures at home ranging from normotensive to systolic of 147.  She denies any weakness, difficulty with speech, facial droop, or other complaints with these episodes.  The history is provided by the patient. No language interpreter was used.      Home Medications Prior to Admission medications   Medication Sig Start Date End Date Taking? Authorizing Provider  EPINEPHrine 0.3 mg/0.3 mL IJ SOAJ injection Inject 0.3 mg into the muscle once as needed for anaphylaxis.    [provider]  NUVARING 0.12-0.015 MG/24HR vaginal ring Place 1 each vaginally every 21 ( twenty-one) days. Insert vaginally and leave in place for 3 consecutive weeks, then remove for 1 week.    [provider]      Allergies    Other, Peanut oil, Peanut-containing drug products, Shellfish-derived products, Latex, Penicillins, and Pork-derived products    Review of Systems   Review of Systems  Constitutional:  Negative for activity change, chills and fever.  Eyes:  Negative for photophobia and visual disturbance.  Respiratory:  Negative for shortness of breath.   Cardiovascular:  Negative for chest pain.   Gastrointestinal:  Negative for nausea and vomiting.  Musculoskeletal:  Positive for neck pain.  Neurological:  Positive for light-headedness and headaches. Negative for dizziness, syncope, speech difficulty, weakness and numbness.  All other systems reviewed and are negative.  Physical Exam Updated Vital Signs BP 111/62    Pulse 87    Temp 98.1 F (36.7 C)    Resp 16    LMP 05/18/2021    SpO2 100%  Physical Exam Vitals and nursing note reviewed.  Constitutional:      General: She is not in acute distress.    Appearance: Normal appearance. She is not ill-appearing.  HENT:     Head: Normocephalic and atraumatic.     Nose: Nose normal.  Eyes:     General: No scleral icterus.    Extraocular Movements: Extraocular movements intact.     Conjunctiva/sclera: Conjunctivae normal.  Neck:     Meningeal: Brudzinski's sign and Kernig's sign absent.  Cardiovascular:     Rate and Rhythm: Normal rate and regular rhythm.     Pulses: Normal pulses.     Heart sounds: Normal heart sounds.  Pulmonary:     Effort: Pulmonary effort is normal. No respiratory distress.     Breath sounds: Normal breath sounds. No wheezing or rales.  Abdominal:     General: There is no distension.     Tenderness: There is no abdominal tenderness.  Musculoskeletal:        General: Normal range of motion.     Cervical back: Normal range of motion. No rigidity.  Skin:    General: Skin is warm and dry.  Neurological:     General: No focal deficit present.     Mental Status: She is alert. Mental status is at baseline.     Cranial Nerves: No cranial nerve deficit, dysarthria or facial asymmetry.     Comments: Cranial nerves II through XII intact.  Sensation intact.  Pupils equal round and reactive.  EOMs intact.  Without nystagmus.  Bilateral upper extremities with full range of motion, 5/5 strength in flexor and extensor muscle groups.  Bilateral lower extremities with full range of motion and 5/5 strength in the flexor  and extensor muscle groups.  2+ radial pulse, and 2+ DP pulse present bilaterally.  Sensation intact.     ED Results / Procedures / Treatments   Labs (all labs ordered are listed, but only abnormal results are displayed) Labs Reviewed - No data to display  EKG None  Radiology No results found.  Procedures Procedures    Medications Ordered in ED Medications  metoCLOPramide (REGLAN) injection 10 mg (has no administration in time range)  diphenhydrAMINE (BENADRYL) injection 12.5 mg (has no administration in time range)  ketorolac (TORADOL) 15 MG/ML injection 15 mg (has no administration in time range)  sodium chloride 0.9 % bolus 1,000 mL (has no administration in time range)    ED Course/ Medical Decision Making/ A&P                           Medical Decision Making Amount and/or Complexity of Data Reviewed Radiology: ordered.  Risk Prescription drug management.   Medical Decision Making / ED Course   This patient presents to the ED for concern of headache, this involves an extensive number of treatment options, and is a complaint that carries with it a high risk of complications and morbidity.  The differential diagnosis includes migraine, tension headache, GCA, acute intracranial process   MDM: 21 year old female presents today for evaluation of headache.  This is patient's third visit between urgent care and emergency room since 3/2.  Headaches are intermittent and random.  Improves with Motrin most of the times.  Endorses lightheadedness occasionally with the episodes.  Denies syncopal episode.  Previous emergency room visit patient had blood work drawn which showed CBC with leukocytosis.  Leukocytosis however has been present on multiple previous CBC.  She also had ESR and CRP done which were both elevated however nonspecific.  ESR was less than 50 which is the cutoff for GCA.  Patient's pain is also on bilateral temporal region.  Given level of ESR elevation, the pain  is located on bilateral temporalis, and her age gets highly unlikely that patient has GCA.  She is also without any red flag signs or symptoms of her headache.  Without any loss of vision.  Unlikely to be CVA.  Neurological exam intact and nonfocal.  Will give migraine cocktail, IV hydration, and obtain CT head without contrast. CT head without acute intracranial process.  Significant improvement in headache following IV hydration and migraine cocktail.  Symptomatic treatment discussed.  Given occasional elevated blood pressure readings.  Discussed blood pressure diary until she follows up with PCP.  Return precautions discussed.  Patient voices understanding and is in agreement with plan.  Patient does have PCP appointment scheduled to establish care for March 31.  She states she is unable to get in sooner without clinic.  We will provide her with information for [facility]  clinic.   Additional history obtained: -Additional history obtained from recent emergency room and urgent care visit reviewed.  Similar symptoms.  Improves after migraine cocktail. -External records from outside source obtained and reviewed including: Chart review including previous notes, labs, imaging, consultation notes   Lab Tests: -I ordered, reviewed, and interpreted labs.   The pertinent results include:   Labs Reviewed - No data to display    EKG  EKG Interpretation  Date/Time:    Ventricular Rate:    PR Interval:    QRS Duration:   QT Interval:    QTC Calculation:   R Axis:     Text Interpretation:           Imaging Studies ordered: I ordered imaging studies including CT head I independently visualized and interpreted imaging. I agree with the radiologist interpretation   Medicines ordered and prescription drug management: Meds ordered this encounter  Medications   metoCLOPramide (REGLAN) injection 10 mg   diphenhydrAMINE (BENADRYL) injection 12.5 mg   ketorolac (TORADOL) 15 MG/ML  injection 15 mg   sodium chloride 0.9 % bolus 1,000 mL    -I have reviewed the patients home medicines and have made adjustments as needed  Social Determinants of Health:  Factors impacting patients care include: Patient lives in [place] and has an appointment scheduled with PCP there.  [place] to find a [place]CP nearby.  Will provide with [facility] community [facility] clinic information   Reevaluation: After the interventions noted above, I reevaluated the patient and found that they have :improved  Co morbidities that complicate the patient evaluation  Past Medical History:  Diagnosis Date   PCOS (polycystic ovarian syndrome)       Dispostion: Patient is appropriate for discharge.  Discharged in stable condition.   Final Clinical Impression(s) / ED Diagnoses Final diagnoses:  Nonintractable episodic headache, unspecified headache type    Rx / DC Orders ED Discharge Orders     None         Evlyn Courier, PA-C 05/29/21 0859    Lucrezia Starch, MD 05/30/21 [phone]

## 2021-05-29 NOTE — Discharge Instructions (Addendum)
Your exam today was reassuring.  You had a CT scan of the head done today which was without acute concerns for serious cause of your headache.  Your headache improved after IV fluids and medication.  Recommend you continue taking Tylenol and ibuprofen as you need to for management of your headache.  Also recommend that you keep a blood pressure diary until you follow-up with your PCP.  I have attached information for the [facility] for you to call and schedule an appointment to see if you can get in sooner at this clinic.  If you have any worsening of your symptoms please return to the emergency room. ?

## 2021-05-29 NOTE — ED Triage Notes (Signed)
Pt was seen on Friday for head pain and stroke like symptoms; was given toradol, reglan, and benadryl with improvement. Pt was woken up this morning at 4am with generalized headache traveling into neck and spine. Enroute to work this morning, pt felt lightheaded and wanted to get checked out. Reports fluctuations in bp at home as well ?

## 2021-07-27 ENCOUNTER — Emergency Department (HOSPITAL_COMMUNITY): Payer: Medicaid - Out of State

## 2021-07-27 ENCOUNTER — Other Ambulatory Visit: Payer: Self-pay

## 2021-07-27 ENCOUNTER — Encounter (HOSPITAL_COMMUNITY): Payer: Self-pay

## 2021-07-27 ENCOUNTER — Emergency Department (HOSPITAL_COMMUNITY)
Admission: EM | Admit: 2021-07-27 | Discharge: 2021-07-27 | Disposition: A | Discharge status: Home / Self Care | Payer: Medicaid - Out of State | Attending: Emergency Medicine | Admitting: Emergency Medicine

## 2021-07-27 DIAGNOSIS — Z91040 Latex allergy status: Secondary | ICD-10-CM | POA: Insufficient documentation

## 2021-07-27 DIAGNOSIS — Z91010 Allergy to peanuts: Secondary | ICD-10-CM | POA: Insufficient documentation

## 2021-07-27 DIAGNOSIS — N939 Abnormal uterine and vaginal bleeding, unspecified: Secondary | ICD-10-CM | POA: Insufficient documentation

## 2021-07-27 LAB — URINALYSIS, ROUTINE W REFLEX MICROSCOPIC
Bacteria, UA: NONE SEEN
Bilirubin Urine: NEGATIVE
Glucose, UA: 50 mg/dL — AB
Ketones, ur: NEGATIVE mg/dL
Leukocytes,Ua: NEGATIVE
Nitrite: NEGATIVE
Protein, ur: 100 mg/dL — AB
RBC / HPF: >50 RBC/hpf — ABNORMAL HIGH (ref 0–5)
Specific Gravity, Urine: 1.014 (ref 1.005–1.030)
pH: 6 (ref 5.0–8.0)

## 2021-07-27 LAB — I-STAT BETA HCG BLOOD, ED (MC, WL, AP ONLY): I-stat hCG, quantitative: <5 m[IU]/mL (ref <5)

## 2021-07-27 LAB — CBC
HCT: 39.2 % (ref 36.0–46.0)
Hemoglobin: 13 g/dL (ref 12.0–15.0)
MCH: 26.8 pg (ref 26.0–34.0)
MCHC: 33.2 g/dL (ref 30.0–36.0)
MCV: 80.8 fL (ref 80.0–100.0)
Platelets: 338 10*3/uL (ref 150–400)
RBC: 4.85 MIL/uL (ref 3.87–5.11)
RDW: 14.4 % (ref 11.5–15.5)
WBC: 12.1 10*3/uL — ABNORMAL HIGH (ref 4.0–10.5)
nRBC: 0 % (ref 0.0–0.2)

## 2021-07-27 LAB — COMPREHENSIVE METABOLIC PANEL
ALT: 21 U/L (ref 0–44)
AST: 21 U/L (ref 15–41)
Albumin: 3.5 g/dL (ref 3.5–5.0)
Alkaline Phosphatase: 63 U/L (ref 38–126)
Anion gap: 9 (ref 5–15)
BUN: 12 mg/dL (ref 6–20)
CO2: 23 mmol/L (ref 22–32)
Calcium: 8.8 mg/dL — ABNORMAL LOW (ref 8.9–10.3)
Chloride: 108 mmol/L (ref 98–111)
Creatinine, Ser: 0.62 mg/dL (ref 0.44–1.00)
GFR, Estimated: >60 mL/min (ref >60)
Glucose, Bld: 103 mg/dL — ABNORMAL HIGH (ref 70–99)
Potassium: 3.8 mmol/L (ref 3.5–5.1)
Sodium: 140 mmol/L (ref 135–145)
Total Bilirubin: 0.7 mg/dL (ref 0.3–1.2)
Total Protein: 7.7 g/dL (ref 6.5–8.1)

## 2021-07-27 LAB — LIPASE, BLOOD: Lipase: 26 U/L (ref 11–51)

## 2021-07-27 MED ORDER — KETOROLAC TROMETHAMINE 30 MG/ML IJ SOLN
30.0000 mg | Freq: Once | INTRAMUSCULAR | Status: AC
Start: 2021-07-27 — End: 2021-07-27
  Administered 2021-07-27: 30 mg via INTRAMUSCULAR
  Filled 2021-07-27: qty 1

## 2021-07-27 NOTE — ED Provider Triage Note (Signed)
Emergency Medicine Provider Triage Evaluation Note ? ?Mary David , a 21 y.o. female  was evaluated in triage.  Pt complains of heavy vaginal bleeding and pelvic cramping for the past 2 days.  States that her pain worsened this morning.  Has not taken any medicine help with her symptoms.  Reports this amount of bleeding is not typical of her menstrual cycle.  She used the NuvaRing but has not used it in the past 2 to 3 months.  Denies any urinary symptoms, vomiting but does report nausea. ? ?Review of Systems  ?Positive: Vaginal bleeding, pelvic pain ?Negative: Vomiting ? ?Physical Exam  ?BP (!) 144/102 (BP Location: Right Arm)   Pulse 71   Temp 98.3 ?F (36.8 ?C) (Oral)   Resp 18   Ht 5\' 6"  (1.676 m)   Wt 127 kg   SpO2 98%   BMI 45.19 kg/m?  ?Gen:   Awake, no distress   ?Resp:  Normal effort ?MSK:   Moves extremities without difficulty  ?Other:  Abdomen is soft ? ?Medical Decision Making  ?Medically screening exam initiated at 10:55 AM.  Appropriate orders placed.  Mary David was informed that the remainder of the evaluation will be completed by another provider, this initial triage assessment does not replace that evaluation, and the importance of remaining in the ED until their evaluation is complete. ? ?Labs ordered ?  ?Delia Heady, PA-C ?07/27/21 1056 ? ?

## 2021-07-27 NOTE — ED Triage Notes (Signed)
Pt reports her period started 2 days ago, but today she woke up with severe pain to lower abdomen that radiates to back and heavy menstrual bleeding. Pt also reports some N/V.  ?

## 2021-07-27 NOTE — ED Notes (Signed)
I provided reinforced discharge education based off of discharge instructions. Pt acknowledged and understood my education. Pt had no further questions/concerns for provider/myself.  °

## 2021-07-27 NOTE — ED Provider Notes (Signed)
?[facility]-EMERGENCY DEPT ?Provider Note ? ? ?CSN: [id number] ?Arrival date & time: 07/27/21  0957 ? ?  ? ?History ? ?Chief Complaint  ?Patient presents with  ? Vaginal Bleeding  ? Abdominal Pain  ? ? ?Mary David is a 21 y.o. female.  Presented to the emergency room due to concern for vaginal bleeding abdominal pain.  Patient states that pain and bleeding started a couple days ago, felt like initially a normal period but then the pain and bleeding got worse this morning.  Has gone through a couple tampons and a couple pads in total today.  She says that the bleeding is worse than her normal menstrual cycle.  No discharge.  Pain is on both left and right pelvic areas. ? ?HPI ? ?  ? ?Home Medications ?Prior to Admission medications   ?Medication Sig Start Date End Date Taking? Authorizing Provider  ?EPINEPHrine 0.3 mg/0.3 mL IJ SOAJ injection Inject 0.3 mg into the muscle once as needed for anaphylaxis.    [provider]  ?NUVARING 0.12-0.015 MG/24HR vaginal ring Place 1 each vaginally every 21 ( twenty-one) days. Insert vaginally and leave in place for 3 consecutive weeks, then remove for 1 week.    [provider]  ?   ? ?Allergies    ?Other, Peanut oil, Peanut-containing drug products, Shellfish-derived products, Latex, Penicillins, Reglan [metoclopramide], and Pork-derived products   ? ?Review of Systems   ?Review of Systems  ?Constitutional:  Negative for chills and fever.  ?HENT:  Negative for ear pain and sore throat.   ?Eyes:  Negative for pain and visual disturbance.  ?Respiratory:  Negative for cough and shortness of breath.   ?Cardiovascular:  Negative for chest pain and palpitations.  ?Gastrointestinal:  Negative for abdominal pain, nausea and vomiting.  ?Genitourinary:  Positive for pelvic pain and vaginal bleeding. Negative for dysuria, hematuria and vaginal discharge.  ?Musculoskeletal:  Positive for arthralgias. Negative for back pain.  ?Skin:  Negative for  color change and rash.  ?Neurological:  Negative for seizures and syncope.  ?All other systems reviewed and are negative. ? ?Physical Exam ?Updated Vital Signs ?BP (!) 152/115   Pulse (!) 57   Temp 98.2 ?F (36.8 ?C) (Oral)   Resp 18   Ht 5\' 6"  (1.676 m)   Wt 127 kg   SpO2 100%   BMI 45.19 kg/m?  ?Physical Exam ?Vitals and nursing note reviewed. Exam conducted with a chaperone present.  ?Constitutional:   ?   General: She is not in acute distress. ?   Appearance: She is well-developed.  ?HENT:  ?   Head: Normocephalic and atraumatic.  ?Eyes:  ?   Conjunctiva/sclera: Conjunctivae normal.  ?Cardiovascular:  ?   Rate and Rhythm: Normal rate and regular rhythm.  ?   Heart sounds: No murmur heard. ?Pulmonary:  ?   Effort: Pulmonary effort is normal. No respiratory distress.  ?   Breath sounds: Normal breath sounds.  ?Abdominal:  ?   Palpations: Abdomen is soft.  ?   Tenderness: There is no abdominal tenderness.  ?Genitourinary: ?   Comments: Abby RN chaperone ?There is a small amount of blood noted in vaginal vault, no frank pooling, no active bleeding noted from cervix, there is some tenderness and bilateral adnexal regions but no cervical motion tenderness. ?Musculoskeletal:     ?   General: No swelling.  ?   Cervical back: Neck supple.  ?Skin: ?   General: Skin is warm and dry.  ?  Capillary Refill: Capillary refill takes less than 2 seconds.  ?Neurological:  ?   Mental Status: She is alert.  ?Psychiatric:     ?   Mood and Affect: Mood normal.  ? ? ?ED Results / Procedures / Treatments   ?Labs ?(all labs ordered are listed, but only abnormal results are displayed) ?Labs Reviewed  ?COMPREHENSIVE METABOLIC PANEL - Abnormal; Notable for the following components:  ?    Result Value  ? Glucose, Bld 103 (*)   ? Calcium 8.8 (*)   ? All other components within normal limits  ?CBC - Abnormal; Notable for the following components:  ? WBC 12.1 (*)   ? All other components within normal limits  ?URINALYSIS, ROUTINE W  REFLEX MICROSCOPIC - Abnormal; Notable for the following components:  ? APPearance CLOUDY (*)   ? Glucose, UA 50 (*)   ? Hgb urine dipstick MODERATE (*)   ? Protein, ur 100 (*)   ? RBC / HPF >50 (*)   ? Crystals PRESENT (*)   ? All other components within normal limits  ?LIPASE, BLOOD  ?I-STAT BETA HCG BLOOD, ED ([facility], [facility], [facility] ONLY)  ? ? ?EKG ?None ? ?Radiology ?US Transvaginal Non-OB ? ?Result Date: 07/27/2021 ?CLINICAL DATA:  Lower pelvic pain EXAM: TRANSABDOMINAL AND TRANSVAGINAL ULTRASOUND OF PELVIS DOPPLER ULTRASOUND OF OVARIES TECHNIQUE: Both transabdominal and transvaginal ultrasound examinations of the pelvis were performed. Transabdominal technique was performed for global imaging of the pelvis including uterus, ovaries, adnexal regions, and pelvic cul-de-sac. It was necessary to proceed with endovaginal exam following the transabdominal exam to visualize the uterus endometrium ovaries. Color and duplex Doppler ultrasound was utilized to evaluate blood flow to the ovaries. COMPARISON:  None Available. FINDINGS: Uterus Measurements: 7 x 3 x 3.7 cm = volume: 42.6 mL. No fibroids or other mass visualized. Endometrium Thickness: 2.4 mm.  No focal abnormality visualized. Right ovary Measurements: 2.9 x 1.2 x 1.4 cm = volume: 27 mL. Normal appearance/no adnexal mass. Left ovary Measurements: 2.5 x 1.6 x 2.1 cm = volume: 4.6 mL. Normal appearance/no adnexal mass. Pulsed Doppler evaluation of both ovaries demonstrates normal low-resistance arterial and venous waveforms. Other findings No abnormal free fluid. IMPRESSION: Negative pelvic ultrasound.  No evidence for ovarian torsion Electronically Signed   By: Jasmine PangKim  Fujinaga M.D.   On: 07/27/2021 17:54  ? ?US Pelvis Complete ? ?Result Date: 07/27/2021 ?CLINICAL DATA:  Lower pelvic pain EXAM: TRANSABDOMINAL AND TRANSVAGINAL ULTRASOUND OF PELVIS DOPPLER ULTRASOUND OF OVARIES TECHNIQUE: Both transabdominal and transvaginal ultrasound examinations of the pelvis were performed.  Transabdominal technique was performed for global imaging of the pelvis including uterus, ovaries, adnexal regions, and pelvic cul-de-sac. It was necessary to proceed with endovaginal exam following the transabdominal exam to visualize the uterus endometrium ovaries. Color and duplex Doppler ultrasound was utilized to evaluate blood flow to the ovaries. COMPARISON:  None Available. FINDINGS: Uterus Measurements: 7 x 3 x 3.7 cm = volume: 42.6 mL. No fibroids or other mass visualized. Endometrium Thickness: 2.4 mm.  No focal abnormality visualized. Right ovary Measurements: 2.9 x 1.2 x 1.4 cm = volume: 27 mL. Normal appearance/no adnexal mass. Left ovary Measurements: 2.5 x 1.6 x 2.1 cm = volume: 4.6 mL. Normal appearance/no adnexal mass. Pulsed Doppler evaluation of both ovaries demonstrates normal low-resistance arterial and venous waveforms. Other findings No abnormal free fluid. IMPRESSION: Negative pelvic ultrasound.  No evidence for ovarian torsion Electronically Signed   By: Jasmine PangKim  Fujinaga M.D.   On: 07/27/2021 17:54  ? ?  [place] Art/Ven Flow Abd Pelv Doppler ? ?Result Date: 07/27/2021 ?CLINICAL DATA:  Lower pelvic pain EXAM: TRANSABDOMINAL AND TRANSVAGINAL ULTRASOUND OF PELVIS DOPPLER ULTRASOUND OF OVARIES TECHNIQUE: Both transabdominal and transvaginal ultrasound examinations of the pelvis were performed. Transabdominal technique was performed for global imaging of the pelvis including uterus, ovaries, adnexal regions, and pelvic cul-de-sac. It was necessary to proceed with endovaginal exam following the transabdominal exam to visualize the uterus endometrium ovaries. Color and duplex Doppler ultrasound was utilized to evaluate blood flow to the ovaries. COMPARISON:  None Available. FINDINGS: Uterus Measurements: 7 x 3 x 3.7 cm = volume: 42.6 mL. No fibroids or other mass visualized. Endometrium Thickness: 2.4 mm.  No focal abnormality visualized. Right ovary Measurements: 2.9 x 1.2 x 1.4 cm = volume: 27 mL. Normal  appearance/no adnexal mass. Left ovary Measurements: 2.5 x 1.6 x 2.1 cm = volume: 4.6 mL. Normal appearance/no adnexal mass. Pulsed Doppler evaluation of both ovaries demonstrates normal low-resistance arterial and venou

## 2021-07-27 NOTE — Discharge Instructions (Signed)
Please follow-up with your gynecologist.  Call their office tomorrow morning to request follow-up appointment.  Continue taking anti-inflammatory such as Motrin for pain.  Come back to ER if you are having increasing bleeding, any lightheadedness, dizziness, shortness of breath, episodes of passing out or other new concerning symptom. ?

## 2021-10-28 ENCOUNTER — Encounter (HOSPITAL_COMMUNITY): Payer: Self-pay

## 2021-10-28 ENCOUNTER — Other Ambulatory Visit: Payer: Self-pay

## 2021-10-28 ENCOUNTER — Emergency Department (HOSPITAL_COMMUNITY)
Admission: EM | Admit: 2021-10-28 | Discharge: 2021-10-28 | Disposition: A | Discharge status: Home / Self Care | Payer: Medicaid - Out of State | Source: Home / Self Care | Attending: Emergency Medicine | Admitting: Emergency Medicine

## 2021-10-28 ENCOUNTER — Emergency Department (HOSPITAL_COMMUNITY)
Admission: EM | Admit: 2021-10-28 | Discharge: 2021-10-28 | Disposition: A | Discharge status: Home / Self Care | Payer: Medicaid - Out of State | Attending: Emergency Medicine | Admitting: Emergency Medicine

## 2021-10-28 DIAGNOSIS — B349 Viral infection, unspecified: Secondary | ICD-10-CM | POA: Insufficient documentation

## 2021-10-28 DIAGNOSIS — K120 Recurrent oral aphthae: Secondary | ICD-10-CM | POA: Insufficient documentation

## 2021-10-28 DIAGNOSIS — Z20822 Contact with and (suspected) exposure to covid-19: Secondary | ICD-10-CM | POA: Diagnosis not present

## 2021-10-28 DIAGNOSIS — Z91010 Allergy to peanuts: Secondary | ICD-10-CM | POA: Insufficient documentation

## 2021-10-28 DIAGNOSIS — Z91040 Latex allergy status: Secondary | ICD-10-CM | POA: Insufficient documentation

## 2021-10-28 DIAGNOSIS — J029 Acute pharyngitis, unspecified: Secondary | ICD-10-CM | POA: Insufficient documentation

## 2021-10-28 LAB — GROUP A STREP BY PCR: Group A Strep by PCR: NOT DETECTED

## 2021-10-28 LAB — SARS CORONAVIRUS 2 BY RT PCR: SARS Coronavirus 2 by RT PCR: NEGATIVE

## 2021-10-28 MED ORDER — LIDOCAINE VISCOUS HCL 2 % MT SOLN: 5.0000 mL | Freq: Three times a day (TID) | OROMUCOSAL | 0 refills | Status: AC | PRN

## 2021-10-28 MED ORDER — ACETAMINOPHEN 500 MG PO TABS
1000.0000 mg | ORAL_TABLET | Freq: Once | ORAL | Status: AC
  Administered 2021-10-28: 1000 mg via ORAL
  Filled 2021-10-28: qty 2

## 2021-10-28 MED ORDER — LIDOCAINE VISCOUS HCL 2 % MT SOLN
15.0000 mL | Freq: Once | OROMUCOSAL | Status: AC
Start: 2021-10-28 — End: 2021-10-28
  Administered 2021-10-28: 15 mL via OROMUCOSAL
  Filled 2021-10-28: qty 15

## 2021-10-28 NOTE — ED Provider Triage Note (Signed)
Emergency Medicine Provider Triage Evaluation Note  Airelle Everding , a 21 y.o. female  was evaluated in triage.  Pt complains of generalized weakness, myalgias, sore throat which began at work today. Symptoms constant, unchanged. No inability to swallow, drooling, SOB. Unknown sick contacts. Is vaccinated against COVID.  Review of Systems  Positive: As above Negative: As above  Physical Exam  BP (!) 148/101 (BP Location: Left Arm)   Pulse 100   Temp (!) 103.2 F (39.6 C) (Oral)   Resp 16   Ht 5\' 6"  (1.676 m)   Wt 127 kg   SpO2 100%   BMI 45.19 kg/m  Gen:   Awake, no distress   Resp:  Normal effort  MSK:   Moves extremities without difficulty  Other:  Erythema and ulcerations to the posterior oropharynx, worse in the right posterior oropharynx. No edema. Tolerating secretions. No voice muffling.  Medical Decision Making  Medically screening exam initiated at 1:12 AM.  Appropriate orders placed.  Telicia Hodgkiss was informed that the remainder of the evaluation will be completed by another provider, this initial triage assessment does not replace that evaluation, and the importance of remaining in the ED until their evaluation is complete.  Febrile illness - tylenol ordered. COVID pending.   Nehemiah Massed, PA-C 10/28/21 12/28/21

## 2021-10-28 NOTE — ED Notes (Signed)
I provided reinforced discharge education based off of after visit summary/care provided. Pt acknowledged and understood my education. Pt had no further questions/concerns for provider/myself. After visit summary provided to pt. 

## 2021-10-28 NOTE — Discharge Instructions (Signed)
Please continue to take the medication you were prescribed yesterday for management of your symptoms. Also, monitor your myChart for your test results. If positive, you will need to get treatment.  Return if development of any new or worsening symptoms

## 2021-10-28 NOTE — ED Notes (Signed)
Patient given discharge instructions, all questions answered. Patient in possession of all belongings, directed to the discharge area  

## 2021-10-28 NOTE — ED Triage Notes (Signed)
Patient coming to ED for evaluation of sore throat and body aches.  Symptoms started today.  Fever noted on arrival

## 2021-10-28 NOTE — ED Provider Notes (Signed)
[facility]-EMERGENCY DEPT Provider Note   CSN: [id number] Arrival date & time: 10/28/21  1456     History  Chief Complaint  Patient presents with   Sore Throat   Mouth Lesions    Mary David is a 21 y.o. female.  Patient with history of tonsillectomy presents today with complaints of sore throat. She states that same began yesterday and has been unchanged since then. States that she also has some sores in the back of her mouth that she noticed around the same time. She was seen for same in the night last night and had a COVID swab that was negative and was diagnosed with a URI and given magic mouthwash for same. States that she was talking to her mom earlier today and told her that she had recent oral intercourse, and her mom was concerned that she might have an oral STD and wanted her to come here for testing. She states that the magic mouthwash seems to have helped her symptoms and she continues to den any trouble swallowing, drooling, or SOB. Also denies vaginal discharge.     The history is provided by the patient. No language interpreter was used.  Sore Throat  Mouth Lesions      Home Medications Prior to Admission medications   Medication Sig Start Date End Date Taking? Authorizing Provider  EPINEPHrine 0.3 mg/0.3 mL IJ SOAJ injection Inject 0.3 mg into the muscle once as needed for anaphylaxis.    [provider]  magic mouthwash (lidocaine, diphenhydrAMINE, alum & mag hydroxide) suspension Swish and swallow 5 mLs 3 (three) times daily as needed for mouth pain. 10/28/21   Palumbo, April, MD  NUVARING 0.12-0.015 MG/24HR vaginal ring Place 1 each vaginally every 21 ( twenty-one) days. Insert vaginally and leave in place for 3 consecutive weeks, then remove for 1 week.    [provider]      Allergies    Other, Peanut oil, Peanut-containing drug products, Shellfish-derived products, Latex, Penicillins, Reglan [metoclopramide], and  Pork-derived products    Review of Systems   Review of Systems  HENT:  Positive for mouth sores.   All other systems reviewed and are negative.   Physical Exam Updated Vital Signs BP 134/85 (BP Location: Left Arm)   Pulse 92   Temp 99.3 F (37.4 C) (Oral)   Resp 18   Ht 5\' 6"  (1.676 m)   Wt 127 kg   LMP 10/28/2021   SpO2 97%   BMI 45.19 kg/m  Physical Exam Vitals and nursing note reviewed.  Constitutional:      General: She is not in acute distress.    Appearance: Normal appearance. She is normal weight. She is not ill-appearing, toxic-appearing or diaphoretic.  HENT:     Head: Normocephalic and atraumatic.     Mouth/Throat:     Mouth: Mucous membranes are moist.     Comments: Erythema and ulcerative appearing lesions to the posterior oropharynx, worse in the right posterior oropharynx. No edema. Tolerating secretions. No voice muffling. Neck:     Thyroid: No thyromegaly.  Cardiovascular:     Rate and Rhythm: Normal rate and regular rhythm.     Heart sounds: Normal heart sounds.  Pulmonary:     Effort: Pulmonary effort is normal. No respiratory distress.     Breath sounds: Normal breath sounds.  Abdominal:     General: Bowel sounds are normal.     Palpations: Abdomen is soft.  Musculoskeletal:  General: Normal range of motion.     Cervical back: Normal range of motion and neck supple.  Lymphadenopathy:     Cervical: No cervical adenopathy.  Skin:    General: Skin is warm and dry.  Neurological:     General: No focal deficit present.     Mental Status: She is alert.  Psychiatric:        Mood and Affect: Mood normal.        Behavior: Behavior normal.     ED Results / Procedures / Treatments   Labs (all labs ordered are listed, but only abnormal results are displayed) Labs Reviewed  GROUP A STREP BY PCR  GC/CHLAMYDIA PROBE AMP ([place]) NOT AT [facility]    EKG None  Radiology No results found.  Procedures Procedures    Medications Ordered  in ED Medications - No data to display  ED Course/ Medical Decision Making/ A&P                           Medical Decision Making  Patient returns today with complaints of sore throat. She is afebrile, nontoxic-appearing, and in no acute distress with reassuring vital signs.  Physical exam reveals a few ulcerative appearing lesions located in the back of her oropharynx, right worse than left.  She states that it is significantly improved since she started the Magic mouthwash.  She continues to be able to swallow without difficulty and is tolerating secretions.  States that the only reason she returns today is at the request of her mom who wanted her to be tested for GC/chlamydia and in her mouth. She has been swabbed for same as well as strep. Strep was negative, GC/Chlamydia is pending. Discussed with patient that this takes 24-48 hours to come back. Offered prophylactic treatment which she did not want. She is stable for discharge, educated on red flag symptoms that would prompt immediate return. Also educated on importance of monitoring myChart for test results and to return if positive. She is understanding and amenable with plan, discharged in stable condition.   Final Clinical Impression(s) / ED Diagnoses Final diagnoses:  Aphthous ulcer of mouth    Rx / DC Orders ED Discharge Orders     None     An After Visit Summary was printed and given to the patient.     Vear Clock 10/28/21 Brock Ra, MD 10/28/21 [phone]

## 2021-10-28 NOTE — ED Triage Notes (Signed)
Patient reports that she has had a sore throat and a cold sore tht started today. Patient states she was seen this AM on the previous shift.

## 2021-10-28 NOTE — ED Provider Triage Note (Signed)
Emergency Medicine Provider Triage Evaluation Note  Mary David , a 21 y.o. female  was evaluated in triage.  Pt complains of sore throat. Was seen last night for same, was diagnosed with a URI and given magic mouthwash for her mouth sores. States she talked to her mom and was told by her to come back for oral GC/Chlamydia and strep testing. She is able to tolerate secretions without difficulty  Review of Systems  Positive:  Negative:   Physical Exam  BP 134/85 (BP Location: Left Arm)   Pulse 92   Temp 99.3 F (37.4 C) (Oral)   Resp 18   Ht 5\' 6"  (1.676 m)   Wt 127 kg   LMP 10/28/2021   SpO2 97%   BMI 45.19 kg/m  Gen:   Awake, no distress   Resp:  Normal effort  MSK:   Moves extremities without difficulty  Other:    Medical Decision Making  Medically screening exam initiated at 4:10 PM.  Appropriate orders placed.  [name] was informed that the remainder of the evaluation will be completed by another provider, this initial triage assessment does not replace that evaluation, and the importance of remaining in the ED until their evaluation is complete.     Nehemiah Massed, PA-C 10/28/21 1612

## 2021-10-28 NOTE — ED Provider Notes (Signed)
[facility]-EMER[facility]ENC[facility] DEPT Provider Note   CSN: [id number] Arrival date & time: 10/28/21  0035     History  Chief Complaint  Patient presents with   Sore Throat    Jozlin Bently is a 21 y.o. female.  The history is provided by the patient.  URI Presenting symptoms: congestion, fever and sore throat   Presenting symptoms comment:  Nasal drainage, body aches  Severity:  Moderate Onset quality:  Gradual Duration: hours. Timing:  Constant Progression:  Unchanged Chronicity:  New Relieved by:  Nothing Worsened by:  Nothing Ineffective treatments:  None tried Associated symptoms: myalgias   Associated symptoms: no wheezing   Risk factors: not elderly, no immunosuppression and no recent illness        Home Medications Prior to Admission medications   Medication Sig Start Date End Date Taking? Authorizing Provider  magic mouthwash (lidocaine, diphenhydrAMINE, alum & mag hydroxide) suspension Swish and swallow 5 mLs 3 (three) times daily as needed for mouth pain. 10/28/21  Yes Graysen Woodyard, MD  EPINEPHrine 0.3 mg/0.3 mL IJ SOAJ injection Inject 0.3 mg into the muscle once as needed for anaphylaxis.    [provider]  NUVARING 0.12-0.015 MG/24HR vaginal ring Place 1 each vaginally every 21 ( twenty-one) days. Insert vaginally and leave in place for 3 consecutive weeks, then remove for 1 week.    [provider]      Allergies    Other, Peanut oil, Peanut-containing drug products, Shellfish-derived products, Latex, Penicillins, Reglan [metoclopramide], and Pork-derived products    Review of Systems   Review of Systems  Constitutional:  Positive for fever.  HENT:  Positive for congestion and sore throat. Negative for facial swelling, trouble swallowing and voice change.   Eyes:  Negative for redness.  Respiratory:  Negative for wheezing and stridor.   Musculoskeletal:  Positive for myalgias.  All other systems reviewed and are  negative.   Physical Exam Updated Vital Signs BP (!) 148/101 (BP Location: Left Arm)   Pulse 100   Temp (!) 103.2 F (39.6 C) (Oral)   Resp 16   Ht 5\' 6"  (1.676 m)   Wt 127 kg   SpO2 100%   BMI 45.19 kg/m  Physical Exam Vitals and nursing note reviewed.  Constitutional:      General: She is not in acute distress.    Appearance: Normal appearance. She is well-developed.  HENT:     Head: Normocephalic and atraumatic.     Nose: Nose normal.     Mouth/Throat:     Mouth: Mucous membranes are moist.     Pharynx: Oropharynx is clear. No oropharyngeal exudate or posterior oropharyngeal erythema.  Eyes:     Pupils: Pupils are equal, round, and reactive to light.  Neck:     Comments: Intact phonation no pain with displacement of the trachea  Cardiovascular:     Rate and Rhythm: Normal rate and regular rhythm.     Pulses: Normal pulses.     Heart sounds: Normal heart sounds.  Pulmonary:     Effort: Pulmonary effort is normal. No respiratory distress.     Breath sounds: Normal breath sounds.  Abdominal:     General: Bowel sounds are normal. There is no distension.     Palpations: Abdomen is soft.     Tenderness: There is no abdominal tenderness. There is no guarding or rebound.  Genitourinary:    Vagina: No vaginal discharge.  Musculoskeletal:        General:  Normal range of motion.     Cervical back: Normal range of motion and neck supple. No rigidity.  Lymphadenopathy:     Cervical: No cervical adenopathy.  Skin:    General: Skin is warm and dry.     Capillary Refill: Capillary refill takes less than 2 seconds.     Findings: No erythema or rash.  Neurological:     General: No focal deficit present.     Mental Status: She is alert and oriented to person, place, and time.     Deep Tendon Reflexes: Reflexes normal.  Psychiatric:        Mood and Affect: Mood normal.     ED Results / Procedures / Treatments   Labs (all labs ordered are listed, but only abnormal results  are displayed) Labs Reviewed  SARS CORONAVIRUS 2 BY RT PCR    EKG None  Radiology No results found.  Procedures Procedures    Medications Ordered in ED Medications  lidocaine (XYLOCAINE) 2 % viscous mouth solution 15 mL (has no administration in time range)  acetaminophen (TYLENOL) tablet 1,000 mg (1,000 mg Oral Given 10/28/21 0142)    ED Course/ Medical Decision Making/ A&P                           Medical Decision Making Congestion nasal drainage and sore throat with body aches starting today   Amount and/or Complexity of Data Reviewed External Data Reviewed: notes.    Details: previous notes reviewed Labs: ordered.    Details: covid is negative  Risk Prescription drug management. Risk Details: Based on CENTOR score there is no indication for further testing or treatment.  No signs of deep tissue infection or epiglottitis.  Will treat symptomatically.  Work note given.  Follow up with your PMD.  Strict return.      Final Clinical Impression(s) / ED Diagnoses Final diagnoses:  Viral illness  Pharyngitis, unspecified etiology   Return for intractable cough, coughing up blood, fevers > 100.4 unrelieved by medication, shortness of breath, intractable vomiting, chest pain, shortness of breath, weakness, numbness, changes in speech, facial asymmetry, abdominal pain, passing out, Inability to tolerate liquids or food, cough, altered mental status or any concerns. No signs of systemic illness or infection. The patient is nontoxic-appearing on exam and vital signs are within normal limits.  I have reviewed the triage vital signs and the nursing notes. Pertinent labs & imaging results that were available during my care of the patient were reviewed by me and considered in my medical decision making (see chart for details). After history, exam, and medical workup I feel the patient has been appropriately medically screened and is safe for discharge home. Pertinent diagnoses were  discussed with the patient. Patient was given return precautions.  Rx / DC Orders ED Discharge Orders          Ordered    magic mouthwash (lidocaine, diphenhydrAMINE, alum & mag hydroxide) suspension  3 times daily PRN        10/28/21 0226              Arcadio Cope, MD 10/28/21 0230

## 2021-10-29 LAB — GC/CHLAMYDIA PROBE AMP (~~LOC~~) NOT AT ARMC
Chlamydia: NEGATIVE
Comment: NEGATIVE
Comment: NORMAL
Neisseria Gonorrhea: NEGATIVE

## 2023-05-19 IMAGING — CT CT HEAD W/O CM
4 series · 15 of 47 positions shown, 17 images · non-contrast
Comparison: None.

CLINICAL DATA: Headache, new or worsening.



[Series 3: head without · axial · non-contrast · 0.46mm/px · z∈[-14,+106]mm · 7 of 33 slices shown, 9 images]
[im 5/33  brain]
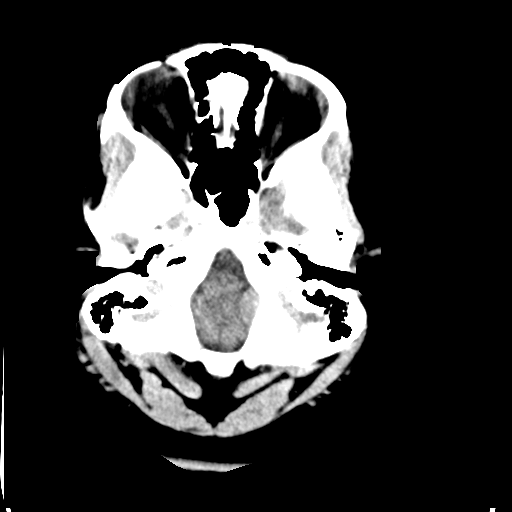
[im 5/33  bone]
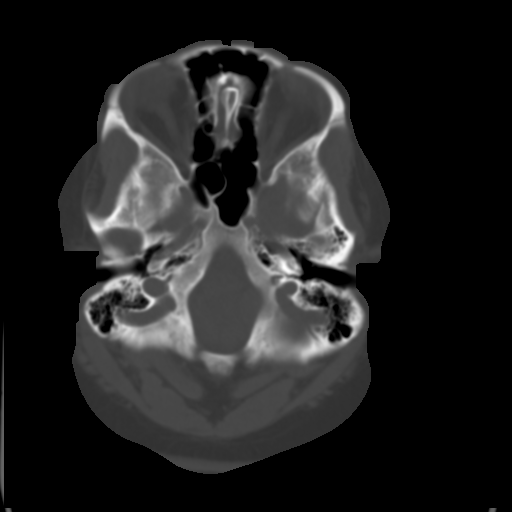
[im 9/33  brain]
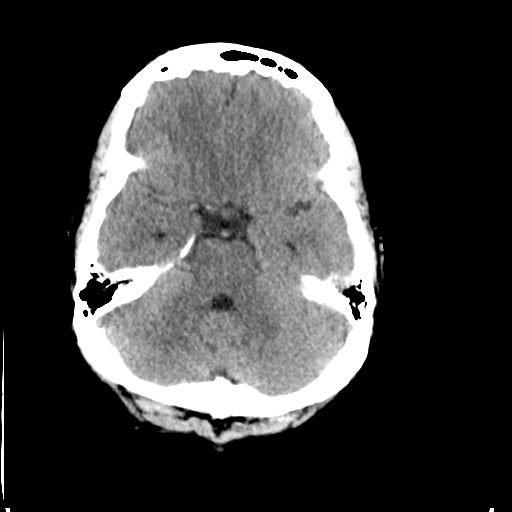
[im 13/33  brain]
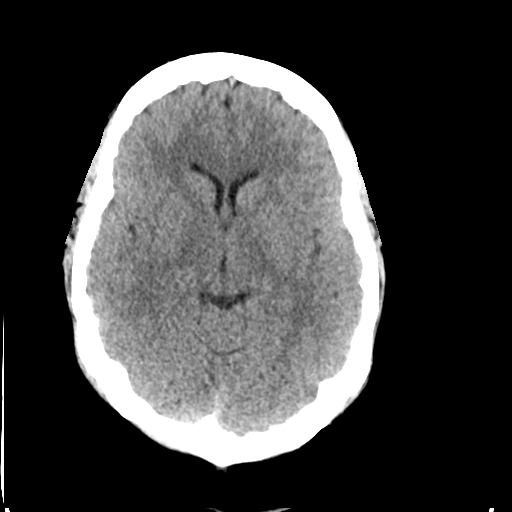
[im 17/33  brain]
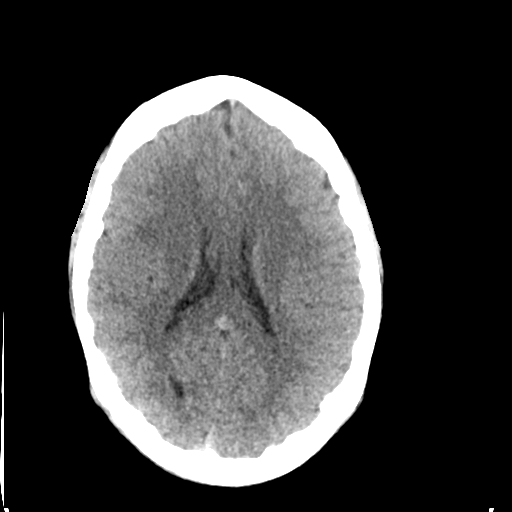
[im 21/33  brain]
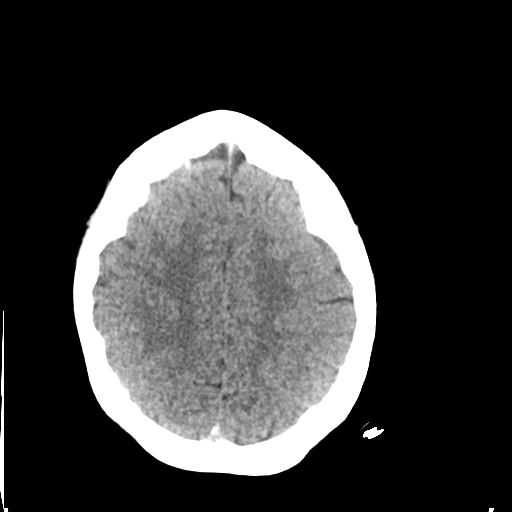
[im 21/33  bone]
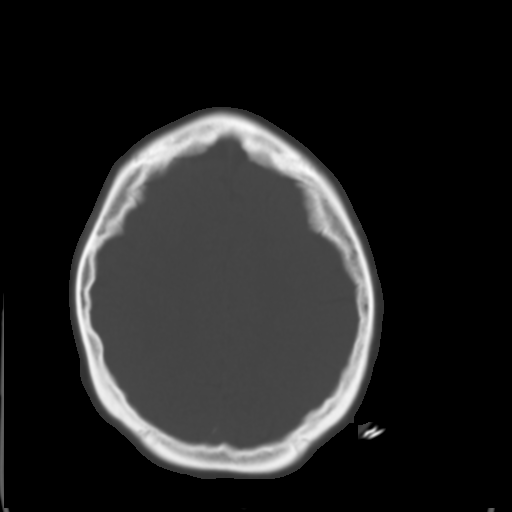
[im 25/33  brain]
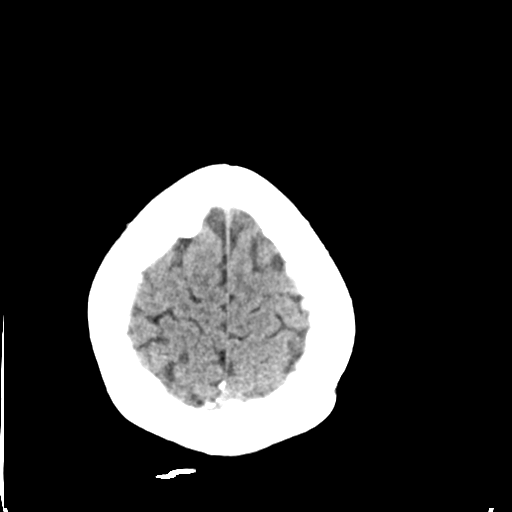
[im 29/33  brain]
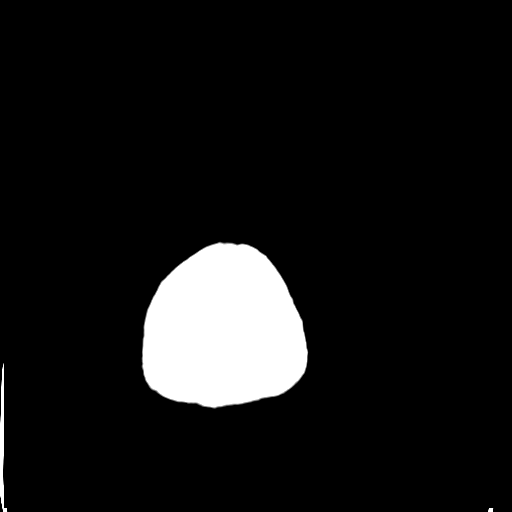

[Series 4: ax head bone · axial · 0.42mm/px · z∈[-6,+9]mm · 2 of 83 slices shown]
[im 9/83  bone]
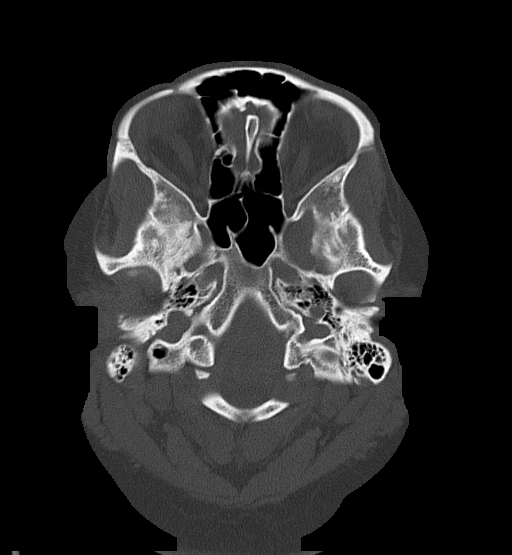
[im 17/83  bone]
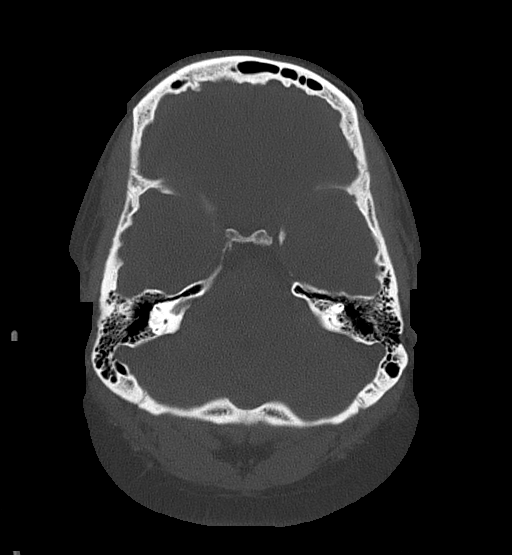

[Series 5: head without cor · coronal · non-contrast · 0.32mm/px · 3 of 68 slices shown]
[im 23/68  brain]
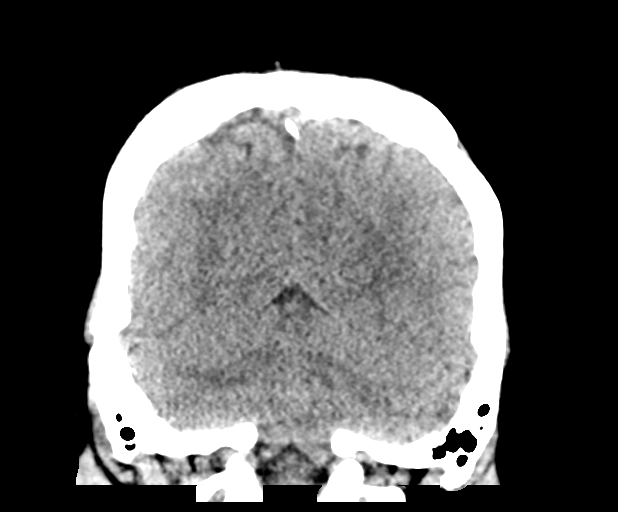
[im 30/68  brain]
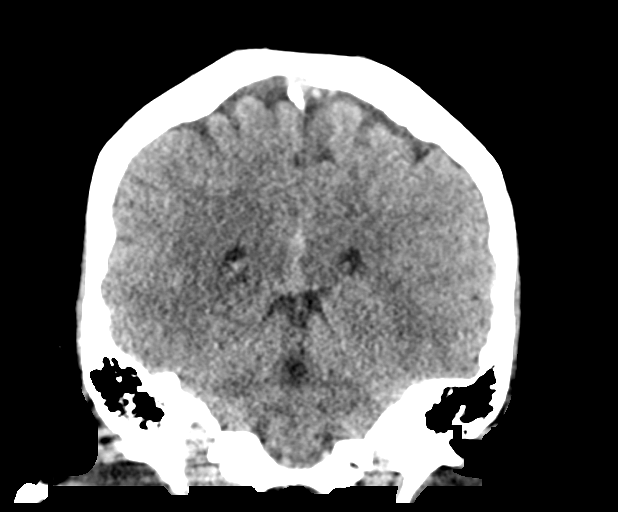
[im 38/68  brain]
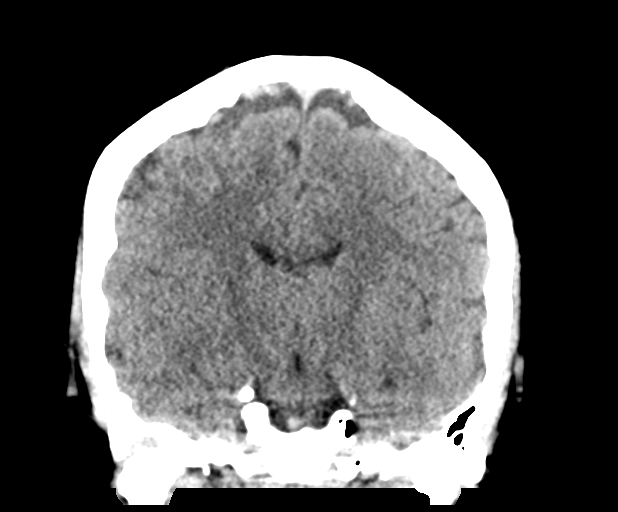

[Series 6: head without sag · sagittal · non-contrast · 0.32mm/px · 3 of 47 slices shown]
[im 16/47  brain]
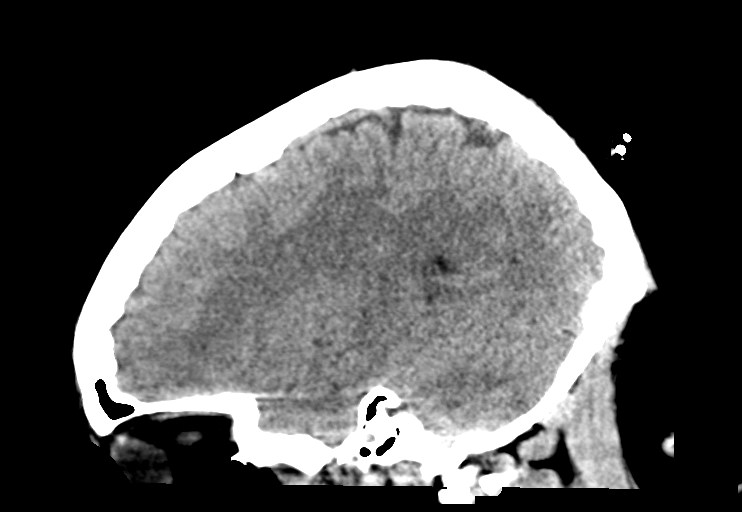
[im 24/47  brain]
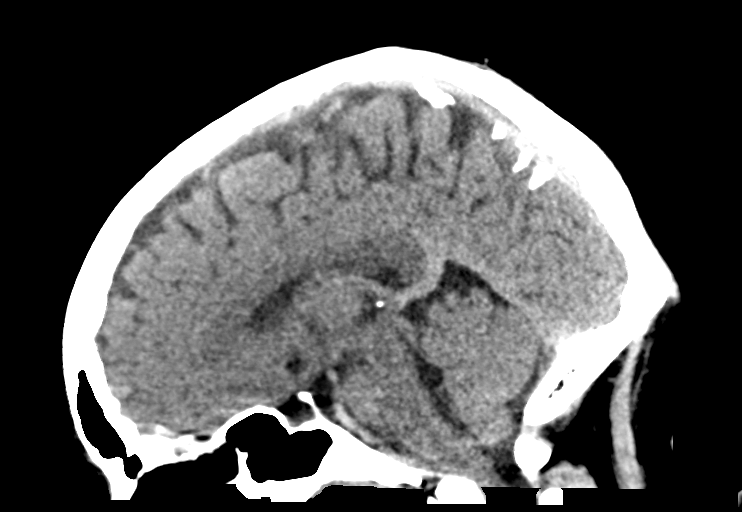
[im 31/47  brain]
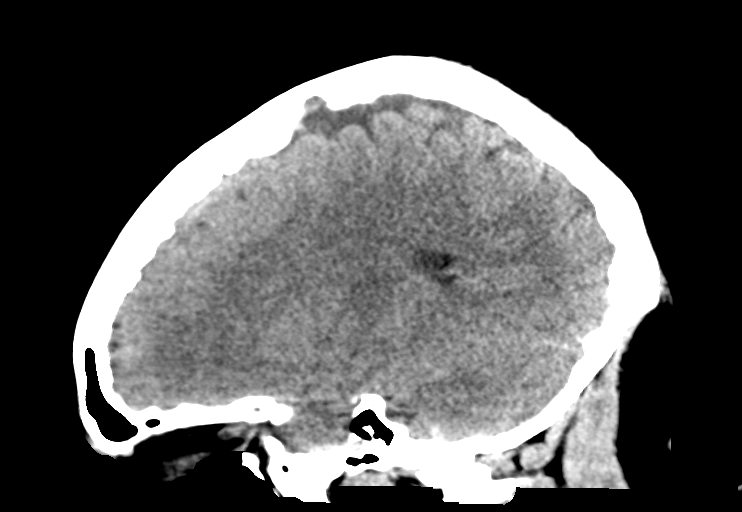

[15 of 47 positions shown; findings below may reference images not displayed]

FINDINGS: Brain: No evidence of acute infarction, hemorrhage, hydrocephalus,
extra-axial collection or mass lesion/mass effect.

Vascular: No hyperdense vessel when accounting for streak artifact
through the posterior fossa.

Skull: Normal. Negative for fracture or focal lesion.

Sinuses/Orbits: No acute finding.
IMPRESSION: Negative head CT.

## 2023-07-17 IMAGING — US US PELVIS COMPLETE
1 series · 15 of 25 positions shown · non-contrast
Comparison: None Available.

CLINICAL DATA: Lower pelvic pain

EXAM:
TRANSABDOMINAL AND TRANSVAGINAL ULTRASOUND OF PELVIS
DOPPLER ULTRASOUND OF OVARIES
TECHNIQUE: Both transabdominal and transvaginal ultrasound examinations of the
pelvis were performed. Transabdominal technique was performed for
global imaging of the pelvis including uterus, ovaries, adnexal
regions, and pelvic cul-de-sac.
It was necessary to proceed with endovaginal exam following the
transabdominal exam to visualize the uterus endometrium ovaries.
Color and duplex Doppler ultrasound was utilized to evaluate blood
flow to the ovaries.

[Series 1: us pelvis complete mc & wl · 15 of 92 slices shown]
[im 1/92]
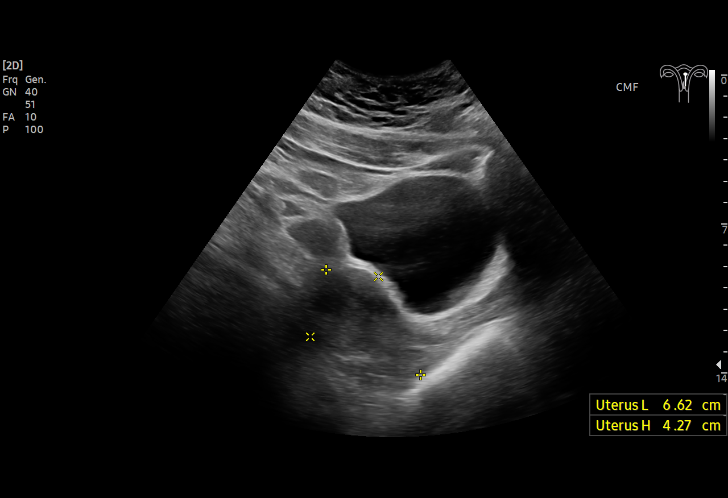
[im 8/92]
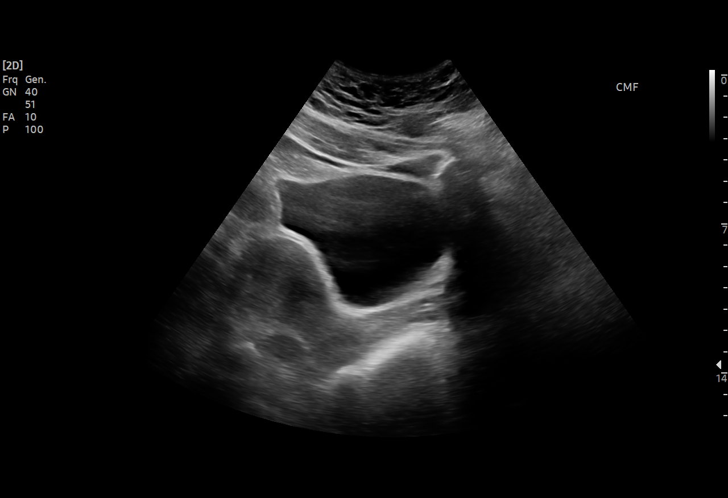
[im 16/92]
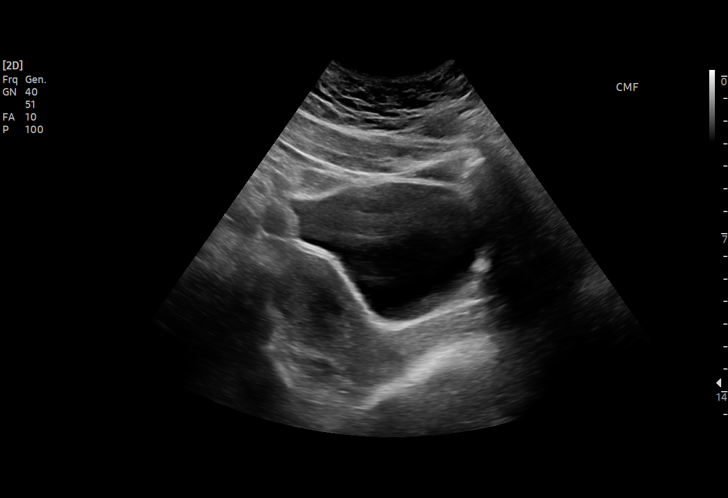
[im 19/92]
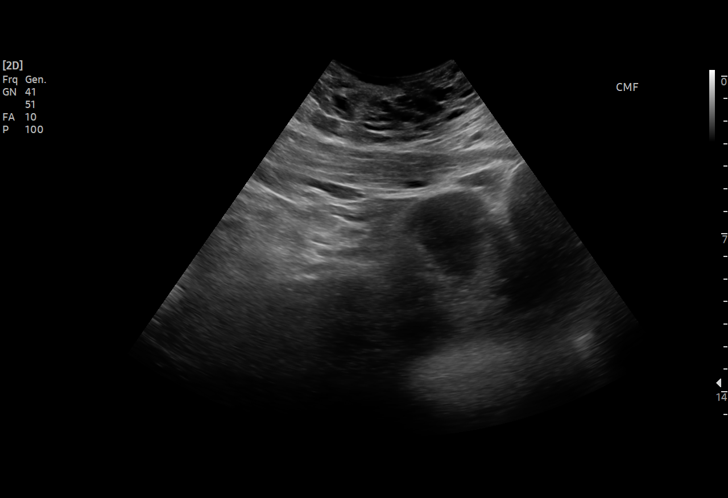
[im 27/92]
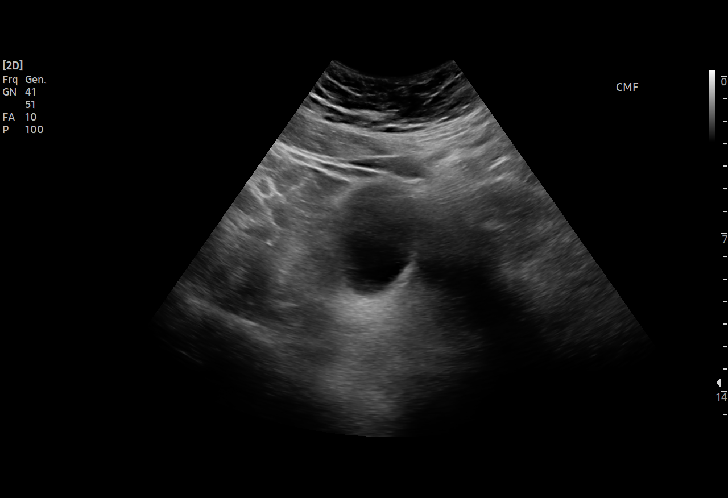
[im 35/92]
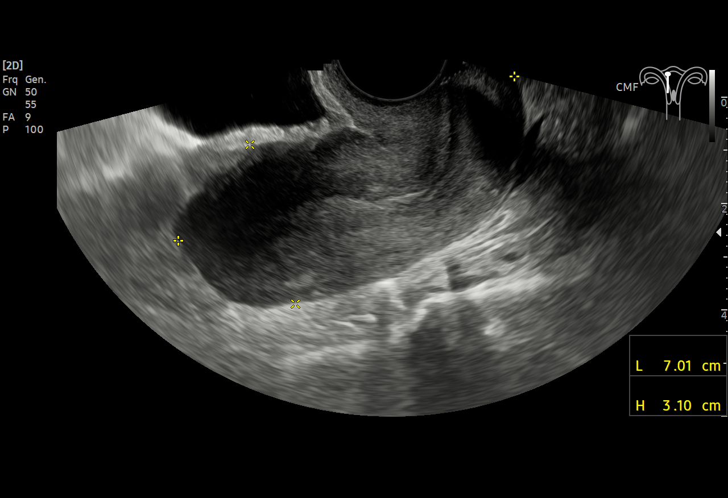
[im 38/92]
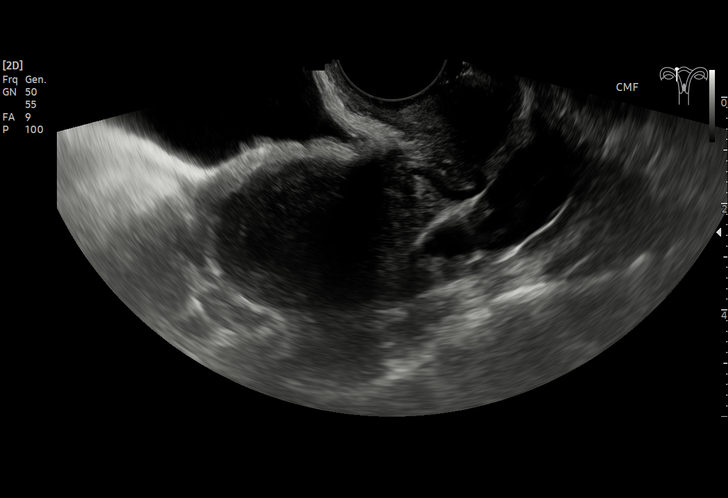
[im 46/92]
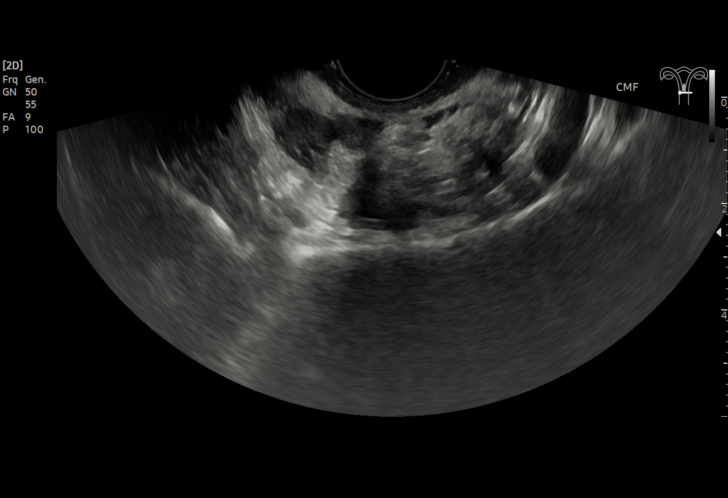
[im 54/92]
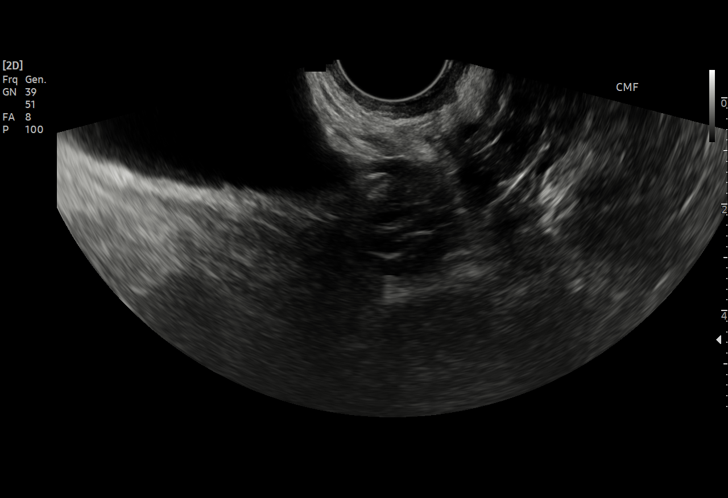
[im 57/92]
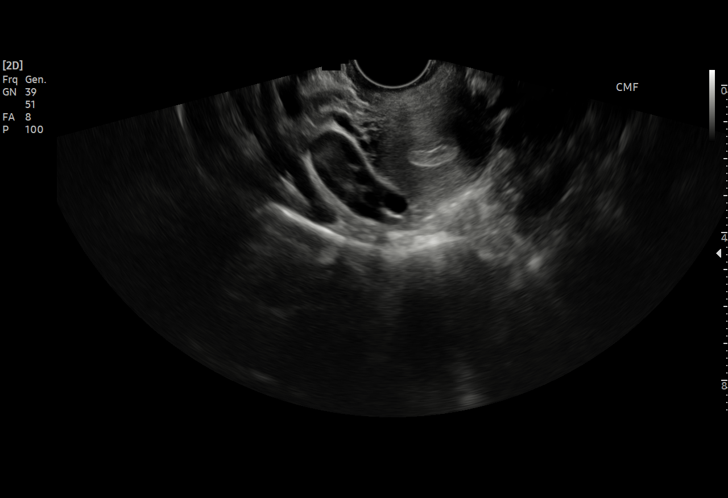
[im 65/92]
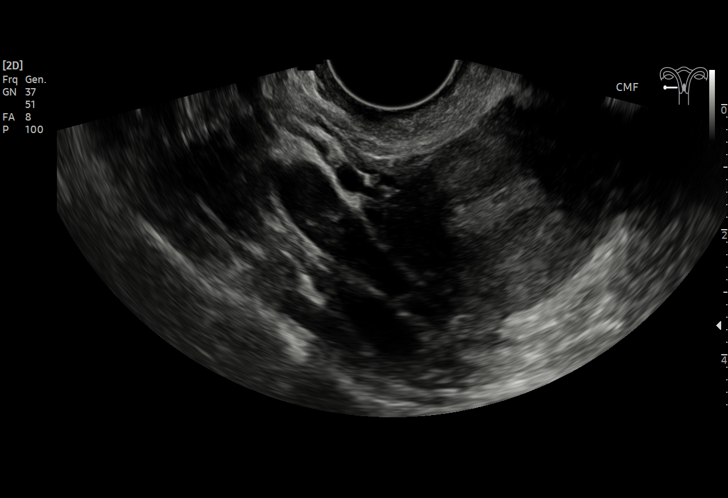
[im 73/92]
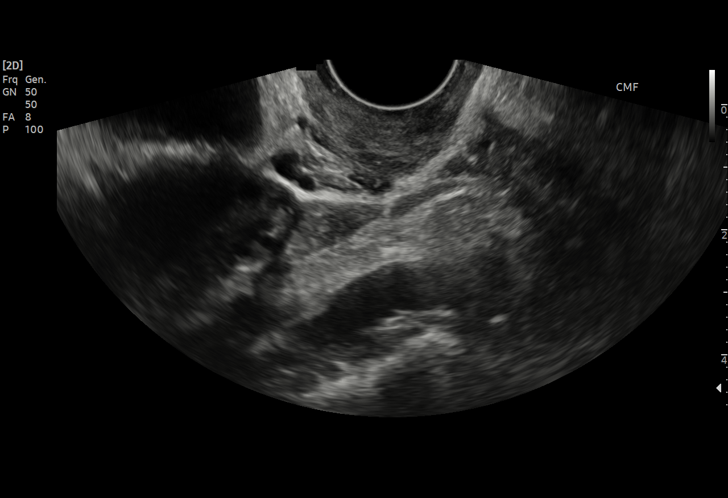
[im 76/92]
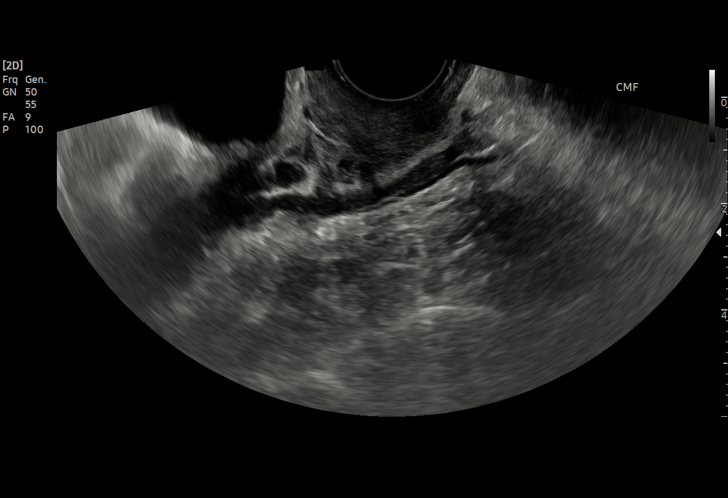
[im 84/92]
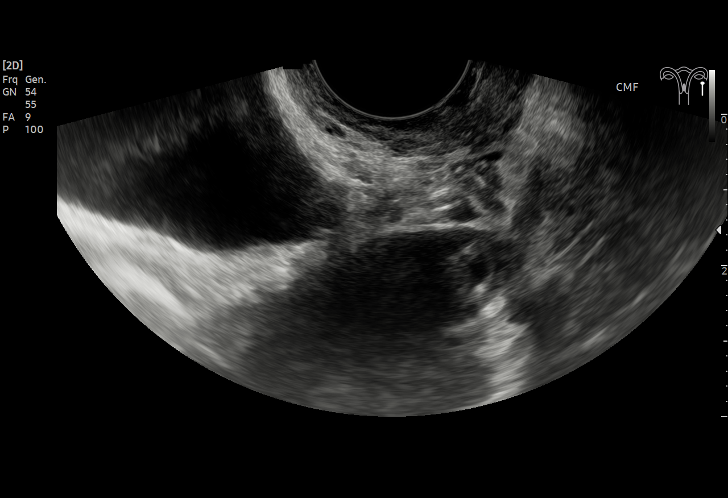
[im 92/92]
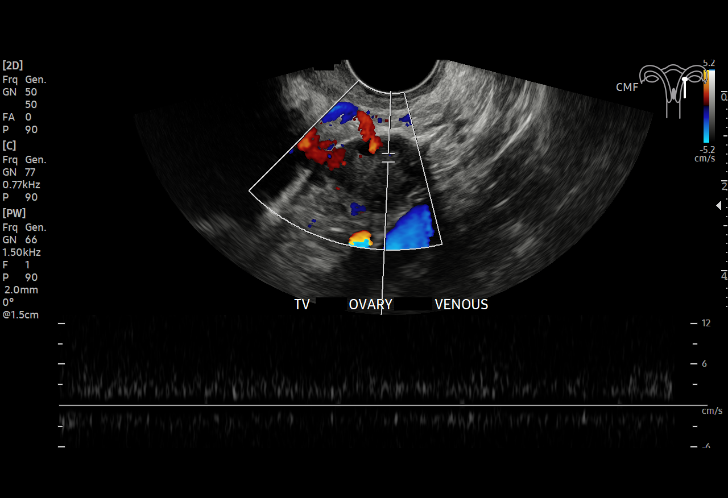

[15 of 25 positions shown; findings below may reference images not displayed]

FINDINGS: Uterus

Measurements: 7 x 3 x 3.7 cm = volume: 42.6 mL. No fibroids or other
mass visualized.

Endometrium

Thickness: 2.4 mm.  No focal abnormality visualized.

Right ovary

Measurements: 2.9 x 1.2 x 1.4 cm = volume: 27 mL. Normal
appearance/no adnexal mass.

Left ovary

Measurements: 2.5 x 1.6 x 2.1 cm = volume: 4.6 mL. Normal
appearance/no adnexal mass.

Pulsed Doppler evaluation of both ovaries demonstrates normal
low-resistance arterial and venous waveforms.

Other findings

No abnormal free fluid.
IMPRESSION: Negative pelvic ultrasound.  No evidence for ovarian torsion
